# Patient Record
Sex: Female | Born: 1955 | Race: Black or African American | Hispanic: No | Marital: Single | Smoking: Current every day smoker
Health system: Southern US, Community
[De-identification: ages and names within clinical notes are randomized; demographics above are authoritative.]

## PROBLEM LIST (undated history)

## (undated) DIAGNOSIS — Z720 Tobacco use: Secondary | ICD-10-CM

## (undated) DIAGNOSIS — I517 Cardiomegaly: Secondary | ICD-10-CM

## (undated) DIAGNOSIS — I071 Rheumatic tricuspid insufficiency: Secondary | ICD-10-CM

## (undated) DIAGNOSIS — J449 Chronic obstructive pulmonary disease, unspecified: Secondary | ICD-10-CM

## (undated) DIAGNOSIS — I340 Nonrheumatic mitral (valve) insufficiency: Secondary | ICD-10-CM

## (undated) DIAGNOSIS — I272 Pulmonary hypertension, unspecified: Secondary | ICD-10-CM

## (undated) HISTORY — DX: Rheumatic tricuspid insufficiency: I07.1

## (undated) HISTORY — DX: Pulmonary hypertension, unspecified: I27.20

## (undated) HISTORY — DX: Cardiomegaly: I51.7

## (undated) HISTORY — DX: Nonrheumatic mitral (valve) insufficiency: I34.0

## (undated) HISTORY — DX: Tobacco use: Z72.0

## (undated) HISTORY — DX: Chronic obstructive pulmonary disease, unspecified: J44.9

---

## 2004-10-20 ENCOUNTER — Ambulatory Visit: Payer: Self-pay

## 2005-11-23 ENCOUNTER — Ambulatory Visit: Payer: Self-pay

## 2013-05-06 ENCOUNTER — Emergency Department: Payer: Self-pay | Admitting: Emergency Medicine

## 2013-05-06 LAB — URINALYSIS, COMPLETE
Blood: NEGATIVE
Nitrite: NEGATIVE
Protein: NEGATIVE
RBC,UR: 1 /HPF (ref 0–5)
Specific Gravity: 1.02 (ref 1.003–1.030)
Squamous Epithelial: <1
WBC UR: 1 /HPF (ref 0–5)

## 2013-05-06 LAB — COMPREHENSIVE METABOLIC PANEL
Albumin: 4.1 g/dL (ref 3.4–5.0)
Alkaline Phosphatase: 125 U/L (ref 50–136)
Anion Gap: 7 (ref 7–16)
BUN: 13 mg/dL (ref 7–18)
Bilirubin,Total: 0.3 mg/dL (ref 0.2–1.0)
Chloride: 107 mmol/L (ref 98–107)
Co2: 25 mmol/L (ref 21–32)
Creatinine: 0.88 mg/dL (ref 0.60–1.30)
Glucose: 87 mg/dL (ref 65–99)
Osmolality: 277 (ref 275–301)
Potassium: 3.7 mmol/L (ref 3.5–5.1)
SGOT(AST): 20 U/L (ref 15–37)
Sodium: 139 mmol/L (ref 136–145)
Total Protein: 8.1 g/dL (ref 6.4–8.2)

## 2013-05-06 LAB — CBC
HCT: 39.9 % (ref 35.0–47.0)
HGB: 13.5 g/dL (ref 12.0–16.0)
MCH: 29.8 pg (ref 26.0–34.0)
MCHC: 33.8 g/dL (ref 32.0–36.0)
MCV: 88 fL (ref 80–100)
Platelet: 200 10*3/uL (ref 150–440)
RBC: 4.52 10*6/uL (ref 3.80–5.20)

## 2013-05-06 LAB — TROPONIN I: Troponin-I: <0.02 ng/mL

## 2013-05-06 LAB — LIPASE, BLOOD: Lipase: 168 U/L (ref 73–393)

## 2014-06-19 IMAGING — CT CT ABD-PELV W/ CM
1 of 3 series · 13 of 32 positions shown, 18 images · non-contrast
Comparison: none

REASON FOR EXAM: (1) RLQ tenderness; (2) RLQ temderness
COMMENTS:

[Series 2: 3mm soft tissue · axial · 0.68mm/px · z∈[-454,-67]mm · 13 of 145 slices shown, 18 images]
[im 8/145  soft-tissue]
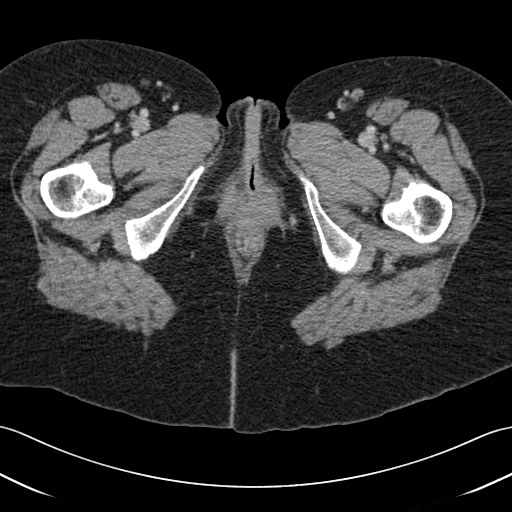
[im 8/145  bone]
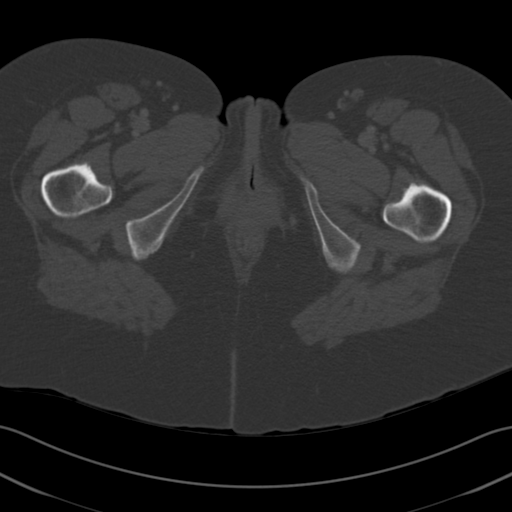
[im 23/145  soft-tissue]
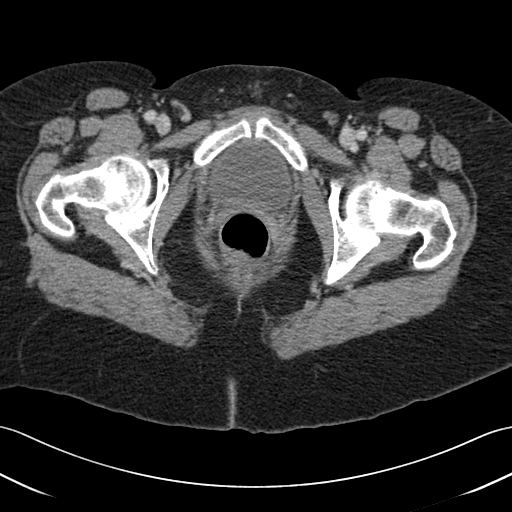
[im 31/145  soft-tissue]
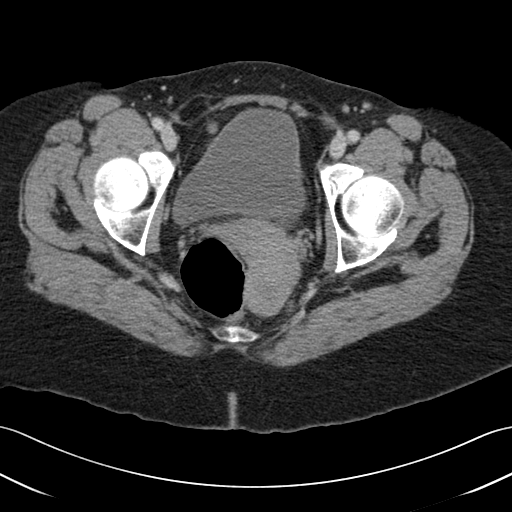
[im 46/145  soft-tissue]
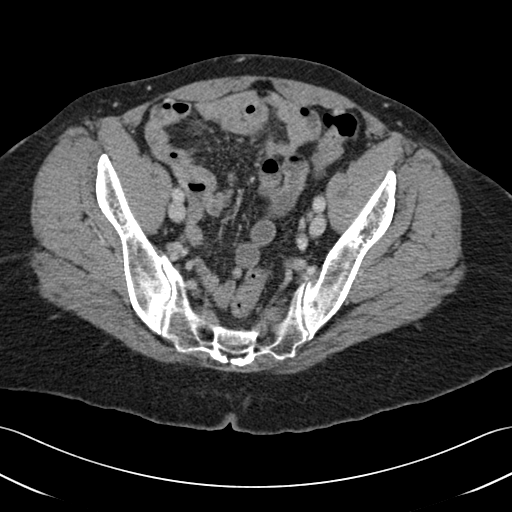
[im 54/145  soft-tissue]
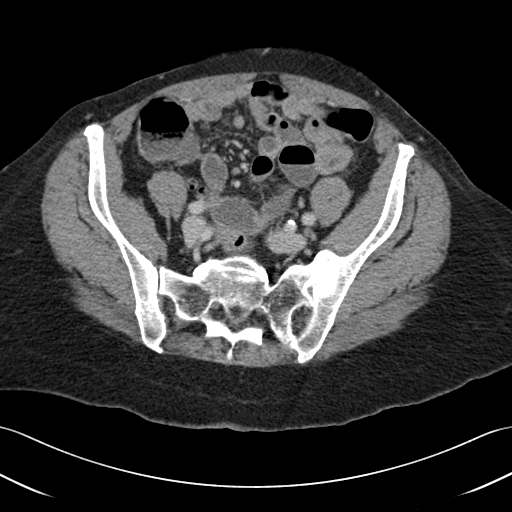
[im 69/145  soft-tissue]
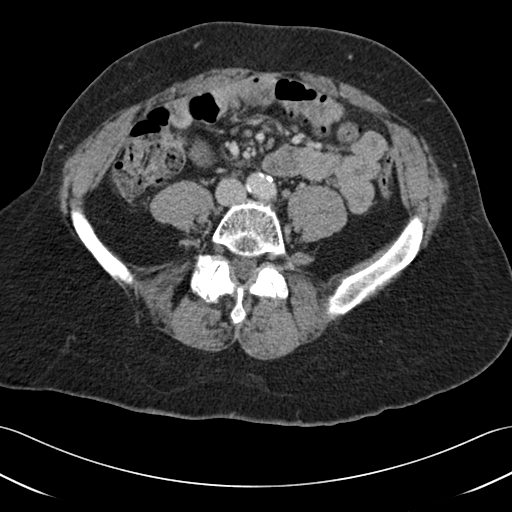
[im 76/145  soft-tissue]
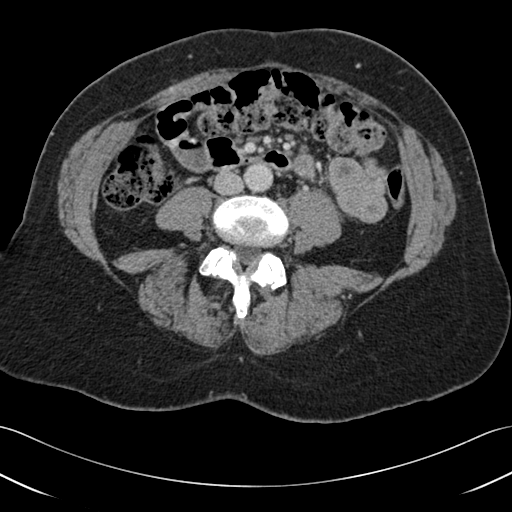
[im 91/145  soft-tissue]
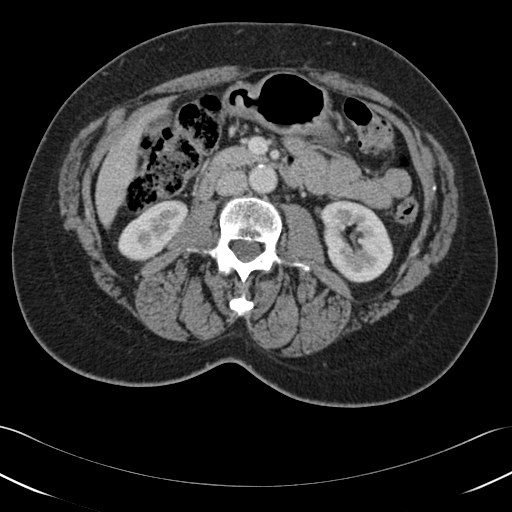
[im 99/145  soft-tissue]
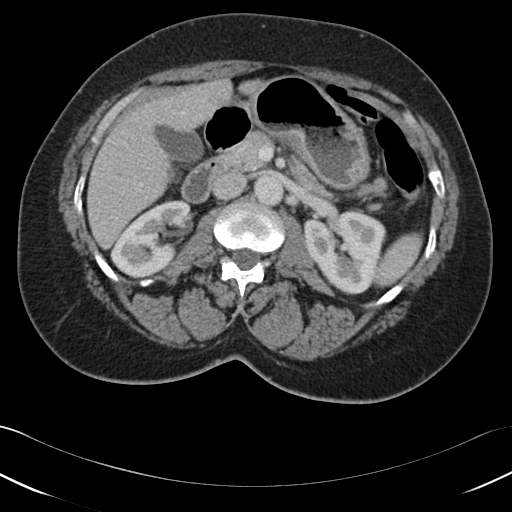
[im 99/145  bone]
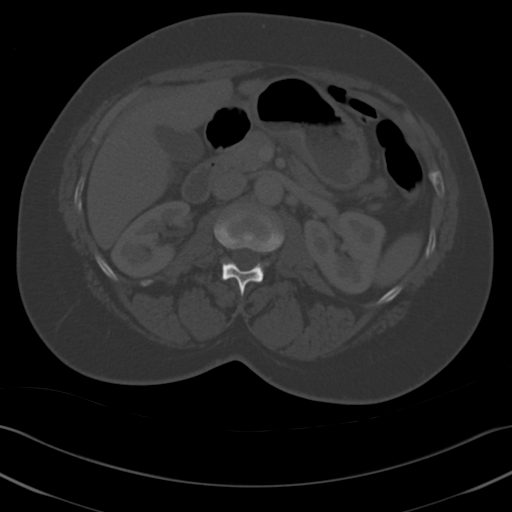
[im 114/145  soft-tissue]
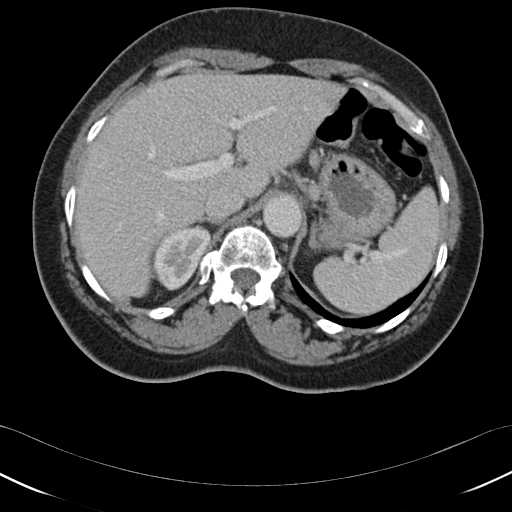
[im 114/145  lung]
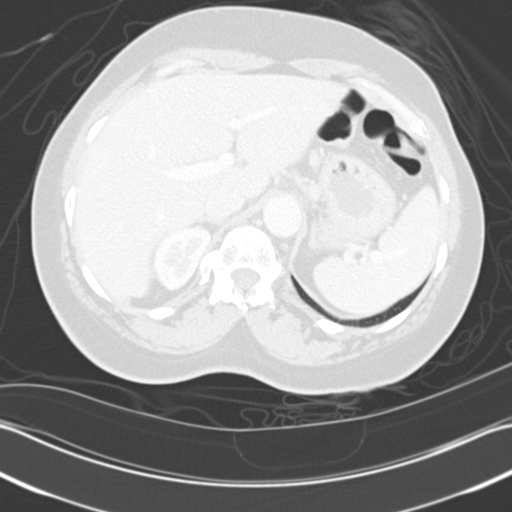
[im 122/145  soft-tissue]
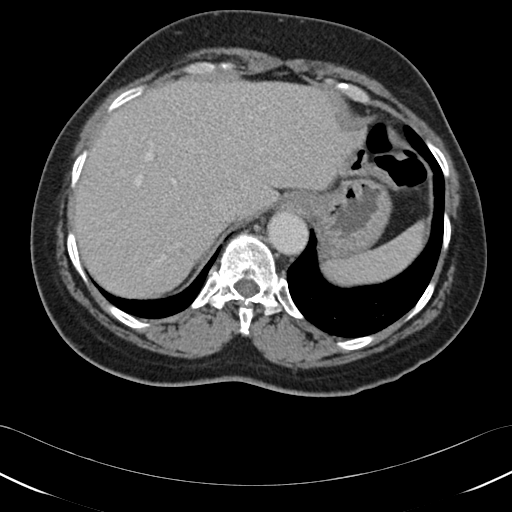
[im 122/145  lung]
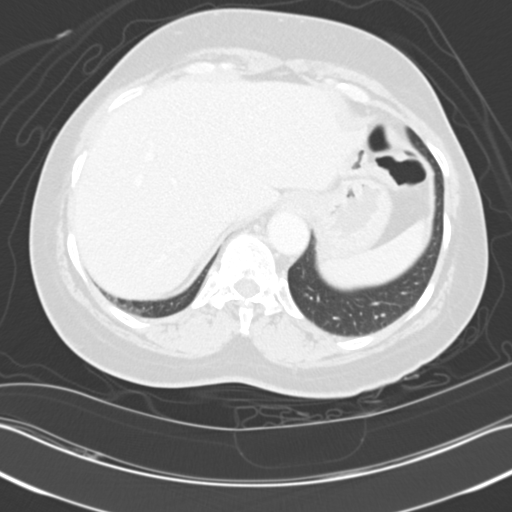
[im 129/145  lung]
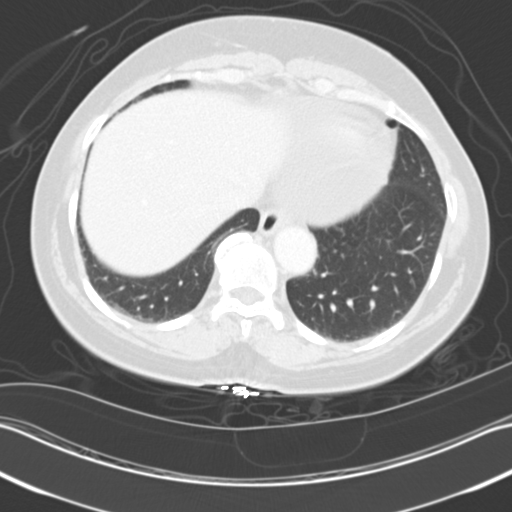
[im 137/145  soft-tissue]
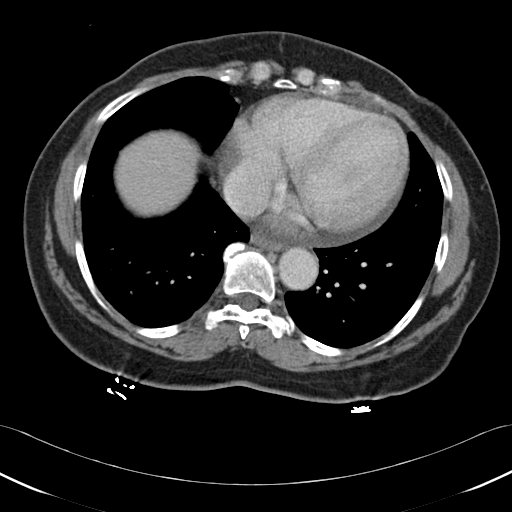
[im 137/145  lung]
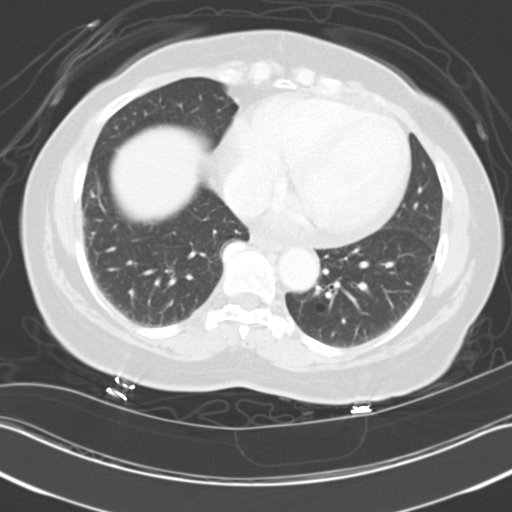

[13 of 32 positions shown; findings below may reference images not displayed]

PROCEDURE:     CT  - CT ABDOMEN / PELVIS  W  - May 07, 2013  [DATE]

RESULT:     Axial CT scanning was performed through the abdomen and pelvis
with reconstructions at 3 mm intervals and slice thicknesses following
intravenous administration of 100 cc of Csovue-D4O. The patient did not
receive oral contrast.

Within the lower thorax the cardiac chambers are top normal in size. There
is a small to moderate sized pericardial effusion. The lung bases are clear.

The liver exhibits no focal mass or ductal dilation. The gallbladder is
adequately distended with no evidence of stones, wall thickening, or
pericholecystic fluid. The pancreas, nondistended stomach, spleen, adrenal
glands, and kidneys are normal in appearance. There is a small hiatal
hernia. The caliber of the abdominal aorta is normal. There is no periaortic
or pericaval lymphadenopathy.

The unopacified loops of small and large bowel exhibit no evidence of ileus
nor of obstruction. There is no evidence of enteritis or colitis the
diverticulitis. There is a normal calibered uninflamed gas-filled appendix
demonstrated on images 85 through
99.

Within the pelvis the uterus is situated to the left of midline is somewhat
retroverted. There is likely a fibroid measuring 3 mm in diameter in the
uterine fundus. There are no adnexal masses. There is no free pelvic fluid.
The partially distended urinary bladder is normal in appearance. There is no
inguinal nor significant umbilical hernia.

On delayed images contrast within the renal collecting systems is normal.
The lumbar vertebral bodies are preserved in height.
IMPRESSION: 1. There is a small to moderate sized pericardial effusion.
2. No acute abnormality of the hepatobiliary tree is demonstrated. There is
a punctate splenic calcification.
3. No acute urinary tract abnormality is demonstrated.
4. No acute abnormality of the bowel is demonstrated.
5. There is likely a fibroid in the retroverted uterus. No adnexal masses
are demonstrated.

A preliminary report was sent to the [HOSPITAL] the conclusion
of the study.

[REDACTED]

## 2016-04-14 ENCOUNTER — Inpatient Hospital Stay
Admission: EM | Admit: 2016-04-14 | Discharge: 2016-04-16 | DRG: 871 | Disposition: A | Discharge status: Home / Self Care | Payer: Managed Care, Other (non HMO) | Attending: Internal Medicine | Admitting: Internal Medicine

## 2016-04-14 ENCOUNTER — Encounter: Payer: Self-pay | Admitting: Emergency Medicine

## 2016-04-14 ENCOUNTER — Emergency Department: Payer: Managed Care, Other (non HMO)

## 2016-04-14 DIAGNOSIS — Z6826 Body mass index (BMI) 26.0-26.9, adult: Secondary | ICD-10-CM

## 2016-04-14 DIAGNOSIS — F17210 Nicotine dependence, cigarettes, uncomplicated: Secondary | ICD-10-CM | POA: Diagnosis present

## 2016-04-14 DIAGNOSIS — D649 Anemia, unspecified: Secondary | ICD-10-CM

## 2016-04-14 DIAGNOSIS — A419 Sepsis, unspecified organism: Secondary | ICD-10-CM | POA: Diagnosis present

## 2016-04-14 DIAGNOSIS — J189 Pneumonia, unspecified organism: Secondary | ICD-10-CM | POA: Diagnosis not present

## 2016-04-14 DIAGNOSIS — D72829 Elevated white blood cell count, unspecified: Secondary | ICD-10-CM

## 2016-04-14 DIAGNOSIS — E876 Hypokalemia: Secondary | ICD-10-CM

## 2016-04-14 DIAGNOSIS — R0781 Pleurodynia: Secondary | ICD-10-CM

## 2016-04-14 LAB — LIPASE, BLOOD: Lipase: 16 U/L (ref 11–51)

## 2016-04-14 LAB — COMPREHENSIVE METABOLIC PANEL
ALBUMIN: 3.7 g/dL (ref 3.5–5.0)
ALK PHOS: 71 U/L (ref 38–126)
ALT: 19 U/L (ref 14–54)
AST: 23 U/L (ref 15–41)
Anion gap: 11 (ref 5–15)
BILIRUBIN TOTAL: 1 mg/dL (ref 0.3–1.2)
BUN: 15 mg/dL (ref 6–20)
CALCIUM: 9.5 mg/dL (ref 8.9–10.3)
CHLORIDE: 105 mmol/L (ref 101–111)
CO2: 24 mmol/L (ref 22–32)
CREATININE: 0.95 mg/dL (ref 0.44–1.00)
Glucose, Bld: 124 mg/dL — ABNORMAL HIGH (ref 65–99)
Potassium: 3.3 mmol/L — ABNORMAL LOW (ref 3.5–5.1)
Sodium: 140 mmol/L (ref 135–145)
Total Protein: 7.6 g/dL (ref 6.5–8.1)

## 2016-04-14 LAB — CBC WITH DIFFERENTIAL/PLATELET
Basophils Absolute: 0 10*3/uL (ref 0–0.1)
Basophils Relative: 0 %
EOS ABS: 0 10*3/uL (ref 0–0.7)
EOS PCT: 0 %
HCT: 42.4 % (ref 35.0–47.0)
Hemoglobin: 14.3 g/dL (ref 12.0–16.0)
LYMPHS ABS: 0.7 10*3/uL — AB (ref 1.0–3.6)
LYMPHS PCT: 5 %
MCH: 30.3 pg (ref 26.0–34.0)
MCHC: 33.6 g/dL (ref 32.0–36.0)
MCV: 89.9 fL (ref 80.0–100.0)
MONO ABS: 0.7 10*3/uL (ref 0.2–0.9)
Monocytes Relative: 5 %
Neutro Abs: 12.5 10*3/uL — ABNORMAL HIGH (ref 1.4–6.5)
Neutrophils Relative %: 90 %
PLATELETS: 206 10*3/uL (ref 150–440)
RBC: 4.72 MIL/uL (ref 3.80–5.20)
RDW: 13.2 % (ref 11.5–14.5)
WBC: 14 10*3/uL — AB (ref 3.6–11.0)

## 2016-04-14 MED ORDER — NICOTINE 21 MG/24HR TD PT24
21.0000 mg | MEDICATED_PATCH | Freq: Every day | TRANSDERMAL | Status: DC
  Administered 2016-04-14: 16:00:00 21 mg via TRANSDERMAL
  Filled 2016-04-14: qty 1

## 2016-04-14 MED ORDER — ONDANSETRON HCL 4 MG/2ML IJ SOLN: 4.0000 mg | Freq: Four times a day (QID) | INTRAMUSCULAR | Status: DC | PRN

## 2016-04-14 MED ORDER — IPRATROPIUM-ALBUTEROL 0.5-2.5 (3) MG/3ML IN SOLN
3.0000 mL | Freq: Once | RESPIRATORY_TRACT | Status: AC
  Administered 2016-04-14: 3 mL via RESPIRATORY_TRACT
  Filled 2016-04-14: qty 3

## 2016-04-14 MED ORDER — ONDANSETRON HCL 4 MG PO TABS: 4.0000 mg | ORAL_TABLET | Freq: Four times a day (QID) | ORAL | Status: DC | PRN

## 2016-04-14 MED ORDER — MORPHINE SULFATE (PF) 4 MG/ML IV SOLN
4.0000 mg | Freq: Once | INTRAVENOUS | Status: AC
  Administered 2016-04-14: 4 mg via INTRAVENOUS
  Filled 2016-04-14: qty 1

## 2016-04-14 MED ORDER — SODIUM CHLORIDE 0.9 % IV BOLUS (SEPSIS)
1000.0000 mL | Freq: Once | INTRAVENOUS | Status: AC
Start: 2016-04-14 — End: 2016-04-14
  Administered 2016-04-14: 1000 mL via INTRAVENOUS

## 2016-04-14 MED ORDER — ALBUTEROL SULFATE (2.5 MG/3ML) 0.083% IN NEBU: 5.0000 mg | INHALATION_SOLUTION | Freq: Once | RESPIRATORY_TRACT | Status: DC

## 2016-04-14 MED ORDER — IPRATROPIUM-ALBUTEROL 0.5-2.5 (3) MG/3ML IN SOLN
3.0000 mL | RESPIRATORY_TRACT | Status: DC
Start: 2016-04-14 — End: 2016-04-15
  Administered 2016-04-14 – 2016-04-15 (×6): 3 mL via RESPIRATORY_TRACT
  Filled 2016-04-14 (×6): qty 3

## 2016-04-14 MED ORDER — ONDANSETRON HCL 4 MG/2ML IJ SOLN
4.0000 mg | Freq: Once | INTRAMUSCULAR | Status: AC
  Administered 2016-04-14: 4 mg via INTRAVENOUS
  Filled 2016-04-14: qty 2

## 2016-04-14 MED ORDER — POTASSIUM CHLORIDE CRYS ER 20 MEQ PO TBCR
40.0000 meq | EXTENDED_RELEASE_TABLET | Freq: Once | ORAL | Status: AC
Start: 2016-04-14 — End: 2016-04-14
  Administered 2016-04-14: 40 meq via ORAL
  Filled 2016-04-14: qty 2

## 2016-04-14 MED ORDER — ENOXAPARIN SODIUM 40 MG/0.4ML ~~LOC~~ SOLN
40.0000 mg | SUBCUTANEOUS | Status: DC
  Administered 2016-04-14: 16:00:00 40 mg via SUBCUTANEOUS
  Filled 2016-04-14 (×2): qty 0.4

## 2016-04-14 MED ORDER — GUAIFENESIN-CODEINE 100-10 MG/5ML PO SOLN
10.0000 mL | ORAL | Status: DC | PRN
  Administered 2016-04-14 – 2016-04-15 (×3): 10 mL via ORAL
  Filled 2016-04-14 (×3): qty 10

## 2016-04-14 MED ORDER — MORPHINE SULFATE (PF) 2 MG/ML IV SOLN
2.0000 mg | INTRAVENOUS | Status: DC | PRN
  Administered 2016-04-15 – 2016-04-16 (×2): 2 mg via INTRAVENOUS
  Filled 2016-04-14 (×2): qty 1

## 2016-04-14 MED ORDER — OXYCODONE HCL 5 MG PO TABS
5.0000 mg | ORAL_TABLET | ORAL | Status: DC | PRN
  Administered 2016-04-14: 5 mg via ORAL
  Filled 2016-04-14: qty 1

## 2016-04-14 MED ORDER — SODIUM CHLORIDE 0.9 % IV SOLN
INTRAVENOUS | Status: DC
  Administered 2016-04-14 – 2016-04-16 (×4): via INTRAVENOUS

## 2016-04-14 MED ORDER — LEVOFLOXACIN IN D5W 750 MG/150ML IV SOLN
750.0000 mg | INTRAVENOUS | Status: DC
  Administered 2016-04-14 – 2016-04-15 (×2): 750 mg via INTRAVENOUS
  Filled 2016-04-14 (×3): qty 150

## 2016-04-14 MED ORDER — ACETAMINOPHEN 325 MG PO TABS: 650.0000 mg | ORAL_TABLET | Freq: Four times a day (QID) | ORAL | Status: DC | PRN

## 2016-04-14 MED ORDER — LEVOFLOXACIN IN D5W 750 MG/150ML IV SOLN
750.0000 mg | Freq: Once | INTRAVENOUS | Status: AC
  Administered 2016-04-14: 750 mg via INTRAVENOUS
  Filled 2016-04-14: qty 150

## 2016-04-14 MED ORDER — ACETAMINOPHEN 650 MG RE SUPP: 650.0000 mg | Freq: Four times a day (QID) | RECTAL | Status: DC | PRN

## 2016-04-14 NOTE — ED Provider Notes (Signed)
[facility] Emergency Department Provider Note  ____________________________________________  Time seen: 10:20 AM  I have reviewed the triage vital signs and the nursing notes.   HISTORY  Chief Complaint Shortness of Breath    HPI Brianna Gibson is a 60 y.o. female who complains of gradually worsening shortness of breath with productive cough for the past 2-3 days. She reports subjective fevers and chills and night sweats. She has been drinking a concoction of lemon juice and garlic and vapor rub. Denies abdominal pain nausea or vomiting but has had some diarrhea over the past day or 2 as well. Does not use oxygen at home. Denies medical history.     History reviewed. No pertinent past medical history. None none  There are no active problems to display for this patient.    History reviewed. No pertinent past surgical history.   No current outpatient prescriptions on file. None  Allergies Review of patient's allergies indicates no known allergies.   History reviewed. No pertinent family history.  Social History Social History  Substance Use Topics  . Smoking status: Current Every Day Smoker  . Smokeless tobacco: None  . Alcohol Use: None    Review of Systems  Constitutional:   Subjective fevers and chills.  Eyes:   No vision changes.  ENT:   No sore throat. No rhinorrhea. Cardiovascular:   No chest pain. Respiratory:   Positive shortness of breath and productive cough. Gastrointestinal:   Negative for abdominal pain, vomiting positive diarrhea.  Genitourinary:   Negative for dysuria or difficulty urinating. Musculoskeletal:   Negative for focal pain or swelling Neurological:   Negative for headaches 10-point ROS otherwise negative.  ____________________________________________   PHYSICAL EXAM:  VITAL SIGNS: ED Triage Vitals  Enc Vitals Group     BP 04/14/16 1003 135/82 mmHg     Pulse Rate 04/14/16 1003 86     Resp 04/14/16  1015 31     Temp 04/14/16 1003 97.6 F (36.4 C)     Temp Source 04/14/16 1003 Oral     SpO2 04/14/16 1003 90 %     Weight 04/14/16 1003 140 lb (63.504 kg)     Height 04/14/16 1003 5\' 7"  (1.702 m)     Head Cir --      Peak Flow --      Pain Score 04/14/16 1004 5     Pain Loc --      Pain Edu? --      Excl. in GC? --     Vital signs reviewed, nursing assessments reviewed.   Constitutional:   Alert and oriented. Ill-appearing. Eyes:   No scleral icterus. No conjunctival pallor. PERRL. EOMI.  No nystagmus. ENT   Head:   Normocephalic and atraumatic.   Nose:   No congestion/rhinnorhea. No septal hematoma   Mouth/Throat:   MMM, no pharyngeal erythema. No peritonsillar mass.    Neck:   No stridor. No SubQ emphysema. No meningismus. Hematological/Lymphatic/Immunilogical:   No cervical lymphadenopathy. Cardiovascular:   RRR. Symmetric bilateral radial and DP pulses.  No murmurs.  Respiratory:   Tachypnea, good air entry in all lung fields, rhonchi in the right base. Expiratory wheezing with coughing.. Gastrointestinal:   Soft with epigastric and left upper quadrant tenderness. Non distended. There is no CVA tenderness.  No rebound, rigidity, or guarding. Genitourinary:   deferred Musculoskeletal:   Nontender with normal range of motion in all extremities. No joint effusions.  No lower extremity tenderness.  No edema. Neurologic:  Normal speech and language.  CN 2-10 normal. Motor grossly intact. No gross focal neurologic deficits are appreciated.  Skin:    Skin is warm, moist and intact. No rash noted.  No petechiae, purpura, or bullae.  ____________________________________________    LABS (pertinent positives/negatives) (all labs ordered are listed, but only abnormal results are displayed) Labs Reviewed  COMPREHENSIVE METABOLIC PANEL - Abnormal; Notable for the following:    Potassium 3.3 (*)    Glucose, Bld 124 (*)    All other components within normal limits   CBC WITH DIFFERENTIAL/PLATELET - Abnormal; Notable for the following:    WBC 14.0 (*)    Neutro Abs 12.5 (*)    Lymphs Abs 0.7 (*)    All other components within normal limits  LIPASE, BLOOD   ____________________________________________   EKG  Interpreted by me Normal sinus rhythm rate of 87, normal axis and intervals. Poor R-wave progression in anterior precordial leads. Normal ST segments and T waves  ____________________________________________    RADIOLOGY  Chest x-ray consistent with right upper lobe and right lower lobe pneumonia  ____________________________________________   PROCEDURES   ____________________________________________   INITIAL IMPRESSION / ASSESSMENT AND PLAN / ED COURSE  Pertinent labs & imaging results that were available during my care of the patient were reviewed by me and considered in my medical decision making (see chart for details).  Patient presents with symptoms and exam concerning for community acquired pneumonia. She also has room air hypoxia with a room air saturation 87% on my exam, she is placed on 3 L nasal cannula to maintain her sat in the 90s. She is breathing more comfortably with this but complains of significant pain. I initially gave her IV Toradol for pain control which was ineffective so I then gave her IV morphine. She does have a leukocytosis as well. She was given IV fluids and IV Levaquin. Case was discussed with the hospitalist about 1:15 PM for admission to the hospital.     ____________________________________________   FINAL CLINICAL IMPRESSION(S) / ED DIAGNOSES  Final diagnoses:  Community acquired pneumonia       Portions of this note were generated with dragon dictation software. Dictation errors may occur despite best attempts at proofreading.   Sharman Cheek, MD 04/14/16 1315

## 2016-04-14 NOTE — ED Notes (Signed)
Per Dr. Scotty CourtStafford no blood cultures needed before abx

## 2016-04-14 NOTE — H&P (Signed)
[facility] [place] [facility]   PATIENT NAME: Brianna Gibson    MR#:  [id number]  DATE OF BIRTH:  25-Aug-1956   DATE OF ADMISSION:  04/14/2016  PRIMARY CARE PHYSICIAN: No primary care provider on file.   REQUESTING/REFERRING PHYSICIAN: Scotty CourtStafford  CHIEF COMPLAINT:   Chief Complaint  Patient presents with  . Shortness of Breath    HISTORY OF PRESENT ILLNESS:  Brianna Gibson  is a 60 y.o. female without significant medical history who is present presenting with cough shortness of breath and fatigue. She states that she has had a productive cough for approximately 2 week total duration which has been progressively worsening, productive of yellowish sputum positive fatigue and shortness of breath particularly dyspnea on exertion even minimal. She does describe right-sided chest pain particularly of the lower mid axillary line worsened with cough as well as inspiration described only as "pain" intensity 6/10 nonradiating. Found to have pneumonia on chest x-ray on initial evaluation, desaturation 90% room air at rest  PAST MEDICAL HISTORY:  History reviewed. No pertinent past medical history.  PAST SURGICAL HISTORY:  History reviewed. No pertinent past surgical history.  SOCIAL HISTORY:   Social History  Substance Use Topics  . Smoking status: Current Every Day Smoker  . Smokeless tobacco: Not on file  . Alcohol Use: Not on file    FAMILY HISTORY:   Family History  Problem Relation Age of Onset  . Hypertension Mother     DRUG ALLERGIES:  No Known Allergies  REVIEW OF SYSTEMS:  REVIEW OF SYSTEMS:  CONSTITUTIONAL: Denies fevers, chills,Positive fatigue, weakness.  EYES: Denies blurred vision, double vision, or eye pain.  EARS, NOSE, THROAT: Denies tinnitus, ear pain, hearing loss.  RESPIRATORY: Positive cough, shortness of breath, denies wheezing  CARDIOVASCULAR: Denies chest pain, palpitations, edema.  GASTROINTESTINAL: Denies nausea, vomiting,  diarrhea, abdominal pain.  GENITOURINARY: Denies dysuria, hematuria.  ENDOCRINE: Denies nocturia or thyroid problems. HEMATOLOGIC AND LYMPHATIC: Denies easy bruising or bleeding.  SKIN: Denies rash or lesions.  MUSCULOSKELETAL: Denies pain in neck, back, shoulder, knees, hips, or further arthritic symptoms.  NEUROLOGIC: Denies paralysis, paresthesias.  PSYCHIATRIC: Denies anxiety or depressive symptoms. Otherwise full review of systems performed by me is negative.   MEDICATIONS AT HOME:   Prior to Admission medications   Medication Sig Start Date End Date Taking? Authorizing Provider  ibuprofen (ADVIL,MOTRIN) 200 MG tablet Take 400 mg by mouth every 6 (six) hours as needed.   Yes Historical Provider, MD      VITAL SIGNS:  Blood pressure 118/82, pulse 65, temperature 97.6 F (36.4 C), temperature source Oral, resp. rate 17, height 5\' 7"  (1.702 m), weight 63.504 kg (140 lb), SpO2 100 %.  PHYSICAL EXAMINATION:  VITAL SIGNS: Filed Vitals:   04/14/16 1200 04/14/16 1300  BP: 124/79 118/82  Pulse: 69 65  Temp:    Resp: 29 17   GENERAL:59 y.o.female currently Ill appearing  HEAD: Normocephalic, atraumatic.  EYES: Pupils equal, round, reactive to light. Extraocular muscles intact. No scleral icterus.  MOUTH: Moist mucosal membrane. Dentition intact. No abscess noted.  EAR, NOSE, THROAT: Clear without exudates. No external lesions.  NECK: Supple. No thyromegaly. No nodules. No JVD.  PULMONARY: Conversational dyspnea, decreased breath sounds with scattered rhonchi right base without wheeze No use of accessory muscles, Good respiratory effort. good air entry bilaterally CHEST: Nontender to palpation.  CARDIOVASCULAR: S1 and S2. Regular rate and rhythm. No murmurs, rubs, or gallops. No edema. Pedal pulses 2+ bilaterally.  GASTROINTESTINAL: Soft, nontender, nondistended. No masses. Positive bowel sounds. No hepatosplenomegaly.  MUSCULOSKELETAL: No swelling, clubbing, or edema. Range of  motion full in all extremities.  NEUROLOGIC: Cranial nerves II through XII are intact. No gross focal neurological deficits. Sensation intact. Reflexes intact.  SKIN: No ulceration, lesions, rashes, or cyanosis. Skin warm and dry. Turgor intact.  PSYCHIATRIC: Mood, affect within normal limits. The patient is awake, alert and oriented x 3. Insight, judgment intact.    LABORATORY PANEL:   CBC  Recent Labs Lab 04/14/16 1018  WBC 14.0*  HGB 14.3  HCT 42.4  PLT 206   ------------------------------------------------------------------------------------------------------------------  Chemistries   Recent Labs Lab 04/14/16 1018  NA 140  K 3.3*  CL 105  CO2 24  GLUCOSE 124*  BUN 15  CREATININE 0.95  CALCIUM 9.5  AST 23  ALT 19  ALKPHOS 71  BILITOT 1.0   ------------------------------------------------------------------------------------------------------------------  Cardiac Enzymes No results for input(s): TROPONINI in the last 168 hours. ------------------------------------------------------------------------------------------------------------------  RADIOLOGY:  Dg Chest 2 View  04/14/2016  CLINICAL DATA:  Cough with with Short of breath. Right chest pain and wheezing EXAM: CHEST  2 VIEW COMPARISON:  None. FINDINGS: Right upper lobe infiltrate consistent with pneumonia. There is a patchy area of right lower lobe infiltrate all also consistent with pneumonia. No significant pleural effusion Left lung is clear. Mild cardiac enlargement without heart failure. IMPRESSION: Right upper lobe and right lower lobe pneumonia. No pleural effusion. Electronically Signed   By: Marlan Palau M.D.   On: 04/14/2016 11:40    EKG:   Orders placed or performed during the hospital encounter of 04/14/16  . EKG 12-Lead  . EKG 12-Lead    IMPRESSION AND PLAN:   60 year old African-American female without significant medical history presenting with shortness of breath  1.Sepsis, meeting  septic criteria by leukocytosis tachycardia tachypnea present on arrival. Source community-acquired pneumonia Panculture. Broad-spectrum antibiotics including Levaquin and taper antibiotics when culture data returns.. Continue IV fluid hydration to keep mean arterial pressure greater than 65. , Oxygen as required, breathing treatments  2. Hypokalemia replace goal 4-5 3. Tobacco abuse: Currently 1 pack a day smoker for numerous years, patient has desire to stop smoking last cigarette approximately 5 days ago patient requests nicotine patch while in the hospital discussed multiple options for smoking cessation on discharge including nicotine replacement, Chantix, Wellbutrin, electronic cigarette-she does not wish to start one of these medications at this time total time spent    All the records are reviewed and case discussed with ED provider. Management plans discussed with the patient, family and they are in agreement.  CODE STATUS: Full  TOTAL TIME TAKING CARE OF THIS PATIENT: 44 minutes.    Hower,  Mardi Mainland.D on 04/14/2016 at 1:48 PM  Between 7am to 6pm - Pager - [phone]  After 6pm: House Pager: - [phone]  [facility]  Office  [phone]  CC: Primary care physician; No primary care provider on file.

## 2016-04-14 NOTE — ED Notes (Signed)
Pt with productive cough with yellow sputum since Tuesday. Sent here from PCP for low sats. 90% RA. 93% 2LNC.

## 2016-04-15 LAB — GLUCOSE, CAPILLARY: Glucose-Capillary: 101 mg/dL — ABNORMAL HIGH (ref 65–99)

## 2016-04-15 MED ORDER — ENSURE ENLIVE PO LIQD
237.0000 mL | Freq: Two times a day (BID) | ORAL | Status: DC
  Administered 2016-04-15: 237 mL via ORAL

## 2016-04-15 MED ORDER — MECLIZINE HCL 25 MG PO TABS
25.0000 mg | ORAL_TABLET | Freq: Three times a day (TID) | ORAL | Status: DC | PRN
  Filled 2016-04-15: qty 1

## 2016-04-15 MED ORDER — IPRATROPIUM-ALBUTEROL 0.5-2.5 (3) MG/3ML IN SOLN
3.0000 mL | Freq: Three times a day (TID) | RESPIRATORY_TRACT | Status: DC
Start: 2016-04-15 — End: 2016-04-16
  Administered 2016-04-15 – 2016-04-16 (×3): 3 mL via RESPIRATORY_TRACT
  Filled 2016-04-15 (×2): qty 3

## 2016-04-15 NOTE — Progress Notes (Signed)
Initial Nutrition Assessment  DOCUMENTATION CODES:   Not applicable  INTERVENTION:   -Cater to pt preferences -Recommend Ensure Enlive po BID, each supplement provides 350 kcal and 20 grams of protein   NUTRITION DIAGNOSIS:   Inadequate oral intake related to poor appetite as evidenced by per patient/family report, meal completion < 25%.  GOAL:   Patient will meet greater than or equal to 90% of their needs  MONITOR:   PO intake, Supplement acceptance, Labs, Weight trends, I & O's  REASON FOR ASSESSMENT:   Malnutrition Screening Tool    ASSESSMENT:   Pt admitted with cough for 2 weeks PTA secondary to community acquired pna and hypokalemia.  History reviewed. No pertinent past medical history.   Diet Order:  Diet Heart Room service appropriate?: Yes; Fluid consistency:: Thin    Pt reports not eating anything since Tuesday, where she ate liver and potatoes and even that she was not able to finish. Pt reports prior to that pt eating soups and salty foods mostly.  Pt c/o taste aversions. Pt reports 'nothing tastes right' which is why she has not wanted to eat at all since Tuesday. Pt does report tolerating cold foods well, such as ice cream or sherbet.   Pt reports eating a sherbet last night with bites of her chicken. This morning on visit pt ate two bites of peaches and had been sipping on cranberry juice to which pt reports 'doesn't taste right either.'   Medications: NS at 11700mL/hr  Labs: K 3.3 (supplemented)   Gastrointestinal Profile: Last BM:  04/12/2016    Nutrition-Focused Physical Exam Findings: Nutrition-Focused physical exam completed. Findings are WDL for fat depletion, muscle depletion, and edema.     Weight Change: Pt reports UBW of 199-200lbs the first of the year and wearing a size 18. Pt reports she has been losing weight but does not know what she is now. Pt reports wearing a uniform and that now she has to wear a belt to keep her pants up. (15%  weight loss in 4 months)   Skin:  Reviewed, no issues   Height:   Ht Readings from Last 1 Encounters:  04/14/16 5\' 7"  (1.702 m)    Weight:   Wt Readings from Last 1 Encounters:  04/14/16 169 lb (76.658 kg)    BMI:  Body mass index is 26.46 kg/(m^2).  Estimated Nutritional Needs:   Kcal:  1818-2150kcals  Protein:  66-77g protein  Fluid:  >2L fluid  EDUCATION NEEDS:   No education needs identified at this time   Brianna Gibson, RD, LDN Pager [phone] Weekend/On-Call Pager [phone]

## 2016-04-15 NOTE — Progress Notes (Signed)
[facility] [place] [facility]   PATIENT NAME: Brianna Gibson    MR#:  [id number]  DATE OF BIRTH:  1956/04/02  SUBJECTIVE:  CHIEF COMPLAINT:   Chief Complaint  Patient presents with  . Shortness of Breath   Cough, sputum and shortness of breath. Right-sided chest pain while deep breathing. REVIEW OF SYSTEMS:  CONSTITUTIONAL: No fever, Has poor appetite and generalized weakness.  EYES: No blurred or double vision.  EARS, NOSE, AND THROAT: No tinnitus or ear pain.  RESPIRATORY: has cough, shortness of breath, wheezing but no hemoptysis.  CARDIOVASCULAR: No chest pain, orthopnea, edema.  GASTROINTESTINAL: No nausea, vomiting, diarrhea or abdominal pain.  GENITOURINARY: No dysuria, hematuria.  ENDOCRINE: No polyuria, nocturia,  HEMATOLOGY: No anemia, easy bruising or bleeding SKIN: No rash or lesion. MUSCULOSKELETAL: No joint pain or arthritis.   NEUROLOGIC: No tingling, numbness, weakness.  PSYCHIATRY: No anxiety or depression.   DRUG ALLERGIES:  No Known Allergies  VITALS:  Blood pressure 112/69, pulse 79, temperature 98.8 F (37.1 C), temperature source Oral, resp. rate 20, height 5\' 7"  (1.702 m), weight 76.658 kg (169 lb), SpO2 97 %.  PHYSICAL EXAMINATION:  GENERAL:  60 y.o.-year-old patient lying in the bed with no acute distress.  EYES: Pupils equal, round, reactive to light and accommodation. No scleral icterus. Extraocular muscles intact.  HEENT: Head atraumatic, normocephalic. Oropharynx and nasopharynx clear.  NECK:  Supple, no jugular venous distention. No thyroid enlargement, no tenderness.  LUNGS: Normal breath sounds bilaterally, no wheezing, right-sided crackles. No use of accessory muscles of respiration.  CARDIOVASCULAR: S1, S2 normal. No murmurs, rubs, or gallops.  ABDOMEN: Soft, nontender, nondistended. Bowel sounds present. No organomegaly or mass.  EXTREMITIES: No pedal edema, cyanosis, or clubbing.  NEUROLOGIC: Cranial nerves  II through XII are intact. Muscle strength 5/5 in all extremities. Sensation intact. Gait not checked.  PSYCHIATRIC: The patient is alert and oriented x 3.  SKIN: No obvious rash, lesion, or ulcer.    LABORATORY PANEL:   CBC  Recent Labs Lab 04/14/16 1018  WBC 14.0*  HGB 14.3  HCT 42.4  PLT 206   ------------------------------------------------------------------------------------------------------------------  Chemistries   Recent Labs Lab 04/14/16 1018  NA 140  K 3.3*  CL 105  CO2 24  GLUCOSE 124*  BUN 15  CREATININE 0.95  CALCIUM 9.5  AST 23  ALT 19  ALKPHOS 71  BILITOT 1.0   ------------------------------------------------------------------------------------------------------------------  Cardiac Enzymes No results for input(s): TROPONINI in the last 168 hours. ------------------------------------------------------------------------------------------------------------------  RADIOLOGY:  Dg Chest 2 View  04/14/2016  CLINICAL DATA:  Cough with with Short of breath. Right chest pain and wheezing EXAM: CHEST  2 VIEW COMPARISON:  None. FINDINGS: Right upper lobe infiltrate consistent with pneumonia. There is a patchy area of right lower lobe infiltrate all also consistent with pneumonia. No significant pleural effusion Left lung is clear. Mild cardiac enlargement without heart failure. IMPRESSION: Right upper lobe and right lower lobe pneumonia. No pleural effusion. Electronically Signed   By: Marlan Palauharles  Clark M.D.   On: 04/14/2016 11:40    EKG:   Orders placed or performed during the hospital encounter of 04/14/16  . EKG 12-Lead  . EKG 12-Lead    ASSESSMENT AND PLAN:   60 year old African-American female without significant medical history presenting with shortness of breath  1.Sepsis with community-acquired pneumonia  Continue Levaquin, follow-up blood culture and sputum culture. DuoNeb when necessary. Follow-up CBC.  2. Hypokalemia. Given potassium and  follow-up BMP.  3. Tobacco  abuse: Counseled by Dr. Clint Guy.  All the records are reviewed and case discussed with Care Management/Social Workerr. Management plans discussed with the patient, her husband and they are in agreement.  CODE STATUS: Full code  TOTAL TIME TAKING CARE OF THIS PATIENT: 37 minutes.  Greater than 50% time was spent on coordination of care and face-to-face counseling.  POSSIBLE D/C IN 2 DAYS, DEPENDING ON CLINICAL CONDITION.   Shaune Pollack M.D on 04/15/2016 at 1:23 PM  Between 7am to 6pm - Pager - [phone]  After 6pm go to [email or web address] - password [email or web address] [facility]  [facility]  Office  [phone]  CC: Primary care physician; No primary care provider on file.

## 2016-04-16 ENCOUNTER — Encounter: Payer: Self-pay | Admitting: Radiology

## 2016-04-16 ENCOUNTER — Inpatient Hospital Stay: Payer: Managed Care, Other (non HMO)

## 2016-04-16 DIAGNOSIS — R0781 Pleurodynia: Secondary | ICD-10-CM

## 2016-04-16 DIAGNOSIS — J189 Pneumonia, unspecified organism: Secondary | ICD-10-CM

## 2016-04-16 DIAGNOSIS — D72829 Elevated white blood cell count, unspecified: Secondary | ICD-10-CM

## 2016-04-16 DIAGNOSIS — E876 Hypokalemia: Secondary | ICD-10-CM

## 2016-04-16 DIAGNOSIS — D649 Anemia, unspecified: Secondary | ICD-10-CM

## 2016-04-16 LAB — BASIC METABOLIC PANEL
Anion gap: 7 (ref 5–15)
CO2: 22 mmol/L (ref 22–32)
CREATININE: 0.76 mg/dL (ref 0.44–1.00)
Calcium: 8.7 mg/dL — ABNORMAL LOW (ref 8.9–10.3)
Chloride: 111 mmol/L (ref 101–111)
GFR calc Af Amer: >60 mL/min (ref >60)
GLUCOSE: 106 mg/dL — AB (ref 65–99)
Potassium: 3.7 mmol/L (ref 3.5–5.1)
SODIUM: 140 mmol/L (ref 135–145)

## 2016-04-16 LAB — CBC
HCT: 32.9 % — ABNORMAL LOW (ref 35.0–47.0)
Hemoglobin: 11.3 g/dL — ABNORMAL LOW (ref 12.0–16.0)
MCH: 31 pg (ref 26.0–34.0)
MCHC: 34.3 g/dL (ref 32.0–36.0)
MCV: 90.5 fL (ref 80.0–100.0)
PLATELETS: 228 10*3/uL (ref 150–440)
RBC: 3.64 MIL/uL — ABNORMAL LOW (ref 3.80–5.20)
RDW: 13 % (ref 11.5–14.5)
WBC: 8.3 10*3/uL (ref 3.6–11.0)

## 2016-04-16 LAB — HEMOGLOBIN A1C: Hgb A1c MFr Bld: 5.5 % (ref 4.0–6.0)

## 2016-04-16 MED ORDER — GUAIFENESIN ER 600 MG PO TB12: 600.0000 mg | ORAL_TABLET | Freq: Two times a day (BID) | ORAL | Status: DC

## 2016-04-16 MED ORDER — GUAIFENESIN-CODEINE 100-10 MG/5ML PO SOLN: 10.0000 mL | ORAL | Status: DC | PRN

## 2016-04-16 MED ORDER — OXYCODONE HCL 5 MG PO TABS: 5.0000 mg | ORAL_TABLET | ORAL | Status: DC | PRN

## 2016-04-16 MED ORDER — POTASSIUM CHLORIDE IN NACL 20-0.9 MEQ/L-% IV SOLN
INTRAVENOUS | Status: DC
  Administered 2016-04-16: 08:00:00 via INTRAVENOUS
  Filled 2016-04-16 (×2): qty 1000

## 2016-04-16 MED ORDER — HYDROCOD POLST-CPM POLST ER 10-8 MG/5ML PO SUER: 5.0000 mL | Freq: Two times a day (BID) | ORAL | Status: DC

## 2016-04-16 MED ORDER — IPRATROPIUM-ALBUTEROL 0.5-2.5 (3) MG/3ML IN SOLN
3.0000 mL | RESPIRATORY_TRACT | Status: DC
  Administered 2016-04-16: 12:00:00 3 mL via RESPIRATORY_TRACT
  Filled 2016-04-16: qty 3

## 2016-04-16 MED ORDER — IBUPROFEN 400 MG PO TABS
400.0000 mg | ORAL_TABLET | Freq: Four times a day (QID) | ORAL | Status: DC | PRN
  Filled 2016-04-16: qty 1

## 2016-04-16 MED ORDER — HYDROCOD POLST-CPM POLST ER 10-8 MG/5ML PO SUER
5.0000 mL | Freq: Two times a day (BID) | ORAL | Status: DC
Start: 2016-04-16 — End: 2016-04-16
  Administered 2016-04-16: 5 mL via ORAL
  Filled 2016-04-16: qty 5

## 2016-04-16 MED ORDER — IOPAMIDOL (ISOVUE-370) INJECTION 76%
75.0000 mL | Freq: Once | INTRAVENOUS | Status: AC | PRN
Start: 2016-04-16 — End: 2016-04-16
  Administered 2016-04-16: 08:00:00 75 mL via INTRAVENOUS

## 2016-04-16 MED ORDER — GUAIFENESIN ER 600 MG PO TB12
600.0000 mg | ORAL_TABLET | Freq: Two times a day (BID) | ORAL | Status: DC
  Administered 2016-04-16: 08:00:00 600 mg via ORAL
  Filled 2016-04-16: qty 1

## 2016-04-16 MED ORDER — NICOTINE 21 MG/24HR TD PT24: 21.0000 mg | MEDICATED_PATCH | Freq: Every day | TRANSDERMAL | Status: DC

## 2016-04-16 NOTE — Progress Notes (Signed)
MD order received to discharge pt home today; verbally reviewed AVS with pt including medications/gave Rxs to pt; diet; activity level and follow up appointment/pt to call primary care physician for appointment, gave pt a copy of physicians [place] benefits; gave pt progress note that included return to work note for her employer; no questions voiced at this time; pt discharged via wheelchair by nursing to the visitor's entrance

## 2016-04-16 NOTE — Care Management Note (Signed)
Case Management Note  Patient Details  Name: Brianna Gibson MRN: [id number] Date of Birth: December 28, 1955  Subjective/Objective:     [facility] but no PCP. This Clinical research associatewriter discussed with her that she should call the [employer] phone number on the back of her insurance to obtain a list of local MD providers in network with [employer].                Action/Plan:   Expected Discharge Date:                  Expected Discharge Plan:     In-House Referral:     Discharge planning Services     Post Acute Care Choice:    Choice offered to:     DME Arranged:    DME Agency:     HH Arranged:    HH Agency:     Status of Service:     Medicare Important Message Given:    Date Medicare IM Given:    Medicare IM give by:    Date Additional Medicare IM Given:    Additional Medicare Important Message give by:     If discussed at Long Length of Stay Meetings, dates discussed:    Additional Comments:  Rowdy Guerrini A, RN 04/16/2016, 10:55 AM

## 2016-04-16 NOTE — Progress Notes (Addendum)
[facility] [place] [facility]   PATIENT NAME: Brianna HarrisonCrystal Ficek    MR#:  [id number]  DATE OF BIRTH:  May 08, 1956  SUBJECTIVE:  CHIEF COMPLAINT:   Chief Complaint  Patient presents with  . Shortness of Breath   The patient is a 60 year old female Who presents to the hospital with Cough, sputum and shortness of breath. Right-sided chest pain while deep breathing. Pain is significant, poorly controlled, now on oxycodone as needed. Complains of shortness of breath and chest pains on the right side. No high fevers, temperature at around 99. Vital cell count has improved. sputum cultures are pending, blood cultures 2 are negative so far  REVIEW OF SYSTEMS:  CONSTITUTIONAL: No fever, Has poor appetite and generalized weakness.  EYES: No blurred or double vision.  EARS, NOSE, AND THROAT: No tinnitus or ear pain.  RESPIRATORY: has cough, shortness of breath, wheezing but no hemoptysis.  CARDIOVASCULAR:Right-sided pleuritic chest  pain, . No orthopnea, edema.  GASTROINTESTINAL: No nausea, vomiting, diarrhea or abdominal pain.  GENITOURINARY: No dysuria, hematuria.  ENDOCRINE: No polyuria, nocturia,  HEMATOLOGY: No anemia, easy bruising or bleeding SKIN: No rash or lesion. MUSCULOSKELETAL: No joint pain or arthritis.   NEUROLOGIC: No tingling, numbness, weakness.  PSYCHIATRY: No anxiety or depression.   DRUG ALLERGIES:  No Known Allergies  VITALS:  Blood pressure 131/83, pulse 77, temperature 99.3 F (37.4 C), temperature source Oral, resp. rate 18, height 5\' 7"  (1.702 m), weight 76.023 kg (167 lb 9.6 oz), SpO2 93 %.  PHYSICAL EXAMINATION:  GENERAL:  60 y.o.-year-old patient lying in the bed in Moderate acute  distress, Intermittently coughing and holding her right side of the chest due to significant pain, short of breath, especially with movements or talking to me.  EYES: Pupils equal, round, reactive to light and accommodation. No scleral icterus. Extraocular  muscles intact.  HEENT: Head atraumatic, normocephalic. Oropharynx and nasopharynx clear.  NECK:  Supple, no jugular venous distention. No thyroid enlargement, no tenderness.  LUNGS:Moderately diminished right-sided breath  sounds , normal on the left, no wheezing,  right-side is  dull to percussion two thirds of lower lung field.  Intermittently using  accessory muscles of respiration.  CARDIOVASCULAR: S1, S2 normal. No murmurs, rubs, or gallops.  ABDOMEN: Soft, nontender, nondistended. Bowel sounds present. No organomegaly or mass.  EXTREMITIES: No pedal edema, cyanosis, or clubbing.  NEUROLOGIC: Cranial nerves II through XII are intact. Muscle strength 5/5 in all extremities. Sensation intact. Gait not checked.  PSYCHIATRIC: The patient is alert and oriented x 3.  SKIN: No obvious rash, lesion, or ulcer.    LABORATORY PANEL:   CBC  Recent Labs Lab 04/16/16 0428  WBC 8.3  HGB 11.3*  HCT 32.9*  PLT 228   ------------------------------------------------------------------------------------------------------------------  Chemistries   Recent Labs Lab 04/14/16 1018 04/16/16 0428  NA 140 140  K 3.3* 3.7  CL 105 111  CO2 24 22  GLUCOSE 124* 106*  BUN 15 <5*  CREATININE 0.95 0.76  CALCIUM 9.5 8.7*  AST 23  --   ALT 19  --   ALKPHOS 71  --   BILITOT 1.0  --    ------------------------------------------------------------------------------------------------------------------  Cardiac Enzymes No results for input(s): TROPONINI in the last 168 hours. ------------------------------------------------------------------------------------------------------------------  RADIOLOGY:  Dg Chest 2 View  04/14/2016  CLINICAL DATA:  Cough with with Short of breath. Right chest pain and wheezing EXAM: CHEST  2 VIEW COMPARISON:  None. FINDINGS: Right upper lobe infiltrate consistent with pneumonia. There is  a patchy area of right lower lobe infiltrate all also consistent with pneumonia. No  significant pleural effusion Left lung is clear. Mild cardiac enlargement without heart failure. IMPRESSION: Right upper lobe and right lower lobe pneumonia. No pleural effusion. Electronically Signed   By: Marlan Palau M.D.   On: 04/14/2016 11:40   Ct Angio Chest Pe W/cm &/or Wo Cm  04/16/2016  CLINICAL DATA:  Right-sided chest pain with inspiration. Cough. Symptoms for 2 weeks. Patient is on antibiotics. Positive tobacco use. No history of surgery or cancer in the thorax. EXAM: CT ANGIOGRAPHY CHEST WITH CONTRAST TECHNIQUE: Multidetector CT imaging of the chest was performed using the standard protocol during bolus administration of intravenous contrast. Multiplanar CT image reconstructions and MIPs were obtained to evaluate the vascular anatomy. CONTRAST:  75 cc Isovue 370 COMPARISON:  CT abdomen 05/07/2013 FINDINGS: There are no filling defects in the pulmonary arterial tree to suggest acute pulmonary thromboembolism. Minimal atherosclerotic calcification in the aortic arch and proximal great vessels. Tiny pericardial effusion. No abnormal mediastinal adenopathy. Small prevascular nodes. Thyroid is unremarkable. No pneumothorax. No left pleural effusion. Tiny right pleural effusion. Heterogeneous opacities in the dependent right upper and lower lobes. Bronchial wall thickening is present in the associated lung zones. These findings suggest bronchial inflammatory change and aspiration pneumonia. There is also emphysema towards the lung apices. Left lung is grossly clear. No acute fracture. Calcified granuloma in the spleen. Review of the MIP images confirms the above findings. IMPRESSION: No evidence of acute pulmonary thromboembolism Patchy airspace disease in the right upper and lower lobes an aspiration pattern. There is associated bronchial wall thickening due to inflammation. Tiny pericardial and right pleural effusions. Electronically Signed   By: Jolaine Click M.D.   On: 04/16/2016 08:57    EKG:    Orders placed or performed during the hospital encounter of 04/14/16  . EKG 12-Lead  . EKG 12-Lead    ASSESSMENT AND PLAN:   60 year old African-American female without significant medical history presenting with shortness of breath  1.Sepsis due to right upper and lower lobe pneumonia , clinically worsened, although patient as well as her count has improved. I ordered a CT scan of the chest to assess for pleural effusion/empyema. Continue Levaquin, Negative blood cultures   so far and pending sputum culture.  continue patient on DuoNeb when necessary. CBC revealed improvement of white blood cell count.Patient's oxygen requirement has worsened, may need to initiate oxygen therapy per nasal cannulas. Added Tussionex and Humibid to alleviate discomfort.  2. Hypokalemia. Given potassium, potassium level normalized,  follow-up BMP Intermittently.  3. Tobacco abuse: Counseled by Dr. Clint Guy, Nicotine replacement therapy is initiated. 4. Leukocytosis, improved with antibiotic therapy. 5. Right-sided pleuritic chest pain, getting CT scan of the chest evaluate for pleural effusion/empyema, initiate patient on ibuprofen, oxycodone as needed, follow clinically  6. Anemia. With rehydration, get Hemoccult, follow patient's hemoglobin level tomorrow morning  All the records are reviewed and case discussed with Care Management/Social Workerr. Management plans discussed with the patient, her husband and they are in agreement.  CODE STATUS: Full code  TOTAL TIME TAKING CARE OF THIS PATIENT: 35 minutes.  Greater than 50% time was spent on coordination of care and face-to-face counseling.  PS. I discussed with the patient her  CT scan results and she stated that she would like to go home with follow-up as outpatient. The patient needs to establish primary care physician, to be followed up as outpatient, we will request social work involvement  in helping patient to find primary care physician. Patient feels  comfortable going home.     Katharina Caper M.D on 04/16/2016 at 10:34 AM  Between 7am to 6pm - Pager - [phone]  After 6pm go to [email or web address] - password [email or web address]  [facility]  Office  [phone]  CC: Primary care physician; No primary care provider on file.

## 2016-04-16 NOTE — Progress Notes (Signed)
          To Whom It May Concern:        Mrs. Brianna HarrisonCrystal Gibson was hospitalized at [facility] with serious disease on 04/14/2016, discharged on 04/16/2016. She is to return to work on 04/25/2016 or later depending on her primary care physician's recommendations. Thank you for your understanding.        Sincerely,   Katharina Caperima Laurence Folz, MD .

## 2016-04-17 LAB — CULTURE, RESPIRATORY: CULTURE: NORMAL

## 2016-04-17 LAB — EXPECTORATED SPUTUM ASSESSMENT W GRAM STAIN, RFLX TO RESP C

## 2016-04-17 LAB — EXPECTORATED SPUTUM ASSESSMENT W REFEX TO RESP CULTURE

## 2016-04-19 LAB — CULTURE, BLOOD (ROUTINE X 2)
CULTURE: NO GROWTH
Culture: NO GROWTH

## 2016-05-01 NOTE — Discharge Summary (Signed)
[facility] [place] [facility]   PATIENT NAME: Brianna Gibson    MR#:  [id number]  DATE OF BIRTH:  11/10/1956  DATE OF ADMISSION:  04/14/2016 ADMITTING PHYSICIAN: Wyatt Hasteavid K Hower, MD  DATE OF DISCHARGE: 04/16/2016  1:25 PM  PRIMARY CARE PHYSICIAN: No primary care provider on file.     ADMISSION DIAGNOSIS:  Community acquired pneumonia [J18.9]  DISCHARGE DIAGNOSIS:  Principal Problem:   Sepsis (HCC) Active Problems:   Pneumonia   Hypokalemia   Leukocytosis   Pleuritic chest pain   Anemia   SECONDARY DIAGNOSIS:  History reviewed. No pertinent past medical history.  .pro HOSPITAL COURSE:  The patient is a 60 year old female Who presented to the hospital with Cough, sputum and shortness of breath,  Right-sided chest pain with deep breathing. Patient had chest x-ray done on admission 27, self April 2017 revealing right upper lobe and right lower lobe pneumonia, no pleural effusions were noted. Patient had CT scan of the chest, showing patchy airspace disease in the right upper and lower lobes and aspiration pattern, associated bronchial wall thickening due to inflammation. Tiny right pleural effusion was also noted. Patient was initiated on broad-spectrum antibiotic therapy, however, blood cultures remained negative and sputum cultures revealed normal respiratory flora, it was felt that patient's condition was related to aspiration, antibiotics were stopped. Patient's pain resolved with pain medications, anti-inflammatory medications. Patient was advised to continue cough suppressants, pain medications as needed and  follow up with primary care physician Discussion by problem: 1.Sepsis due to right upper and lower lobe pneumonitis, likely aspiration, sputum cultures revealed normal respiratory flora, but blood cultures remained negative.  CT scan of the chest revealed no significant pleural effusion, presentation was not consistent with empyema. Antibiotic was  stopped. Patient is to continue Tussionex and Humibid to alleviate discomfort, pain medications, anti-inflammatory medications. Patient is to follow-up with primary care physician for further recommendations.Cliffton Asters. White blood cell count normalized with therapy 2. Hypokalemia. Given potassium, potassium level normalized, follow-up BMP Intermittently as outpatient.  3. Tobacco abuse: Counseled by Dr. Clint GuyHower, Nicotine replacement therapy is to be continued as outpatient. 4. Leukocytosis, resolved with antibiotic therapy. 5. Right-sided pleuritic chest pain, CT scan of the chest is not consistent with empyema, continue patient on ibuprofen, oxycodone as needed, improved clinically  6. Anemia. With rehydration, Hemoccult was ordered, however, not receive during this admission, follow patient's hemoglobin level as outpatient, no active bleeding was noted. 7. Hyperglycemia, hemoglobin level of 5.5, no diabetes. DISCHARGE CONDITIONS:   Stable  CONSULTS OBTAINED:  Treatment Team:  Wyatt Hasteavid K Hower, MD  DRUG ALLERGIES:  No Known Allergies  DISCHARGE MEDICATIONS:   Discharge Medication List as of 04/16/2016 11:31 AM    START taking these medications   Details  chlorpheniramine-HYDROcodone (TUSSIONEX) 10-8 MG/5ML SUER Take 5 mLs by mouth every 12 (twelve) hours., Starting 04/16/2016, Until Discontinued, Normal    guaiFENesin (MUCINEX) 600 MG 12 hr tablet Take 1 tablet (600 mg total) by mouth 2 (two) times daily., Starting 04/16/2016, Until Discontinued, Normal    guaiFENesin-codeine 100-10 MG/5ML syrup Take 10 mLs by mouth every 4 (four) hours as needed for cough., Starting 04/16/2016, Until Discontinued, Print    nicotine (NICODERM CQ - DOSED IN MG/24 HOURS) 21 mg/24hr patch Place 1 patch (21 mg total) onto the skin daily., Starting 04/16/2016, Until Discontinued, Normal    oxyCODONE (OXY IR/ROXICODONE) 5 MG immediate release tablet Take 1 tablet (5 mg total) by mouth every 4 (four) hours as needed  for  moderate pain., Starting 04/16/2016, Until Discontinued, Print      CONTINUE these medications which have NOT CHANGED   Details  ibuprofen (ADVIL,MOTRIN) 200 MG tablet Take 400 mg by mouth every 6 (six) hours as needed., Until Discontinued, Historical Med         DISCHARGE INSTRUCTIONS:    Patient is to follow-up with primary care physician  If you experience worsening of your admission symptoms, develop shortness of breath, life threatening emergency, suicidal or homicidal thoughts you must seek medical attention immediately by calling 911 or calling your MD immediately  if symptoms less severe.  You Must read complete instructions/literature along with all the possible adverse reactions/side effects for all the Medicines you take and that have been prescribed to you. Take any new Medicines after you have completely understood and accept all the possible adverse reactions/side effects.   Please note  You were cared for by a hospitalist during your hospital stay. If you have any questions about your discharge medications or the care you received while you were in the hospital after you are discharged, you can call the unit and asked to speak with the hospitalist on call if the hospitalist that took care of you is not available. Once you are discharged, your primary care physician will handle any further medical issues. Please note that NO REFILLS for any discharge medications will be authorized once you are discharged, as it is imperative that you return to your primary care physician (or establish a relationship with a primary care physician if you do not have one) for your aftercare needs so that they can reassess your need for medications and monitor your lab values.    Today   CHIEF COMPLAINT:   Chief Complaint  Patient presents with  . Shortness of Breath    HISTORY OF PRESENT ILLNESS:  Brianna Gibson  is a 60 y.o. female with a not significant past medical history who presented  to the hospital with Cough, sputum and shortness of breath,  Right-sided chest pain with deep breathing. Patient had chest x-ray done on admission 27, self April 2017 revealing right upper lobe and right lower lobe pneumonia, no pleural effusions were noted. Patient had CT scan of the chest, showing patchy airspace disease in the right upper and lower lobes and aspiration pattern, associated bronchial wall thickening due to inflammation. Tiny right pleural effusion was also noted. Patient was initiated on broad-spectrum antibiotic therapy, however, blood cultures remained negative and sputum cultures revealed normal respiratory flora, it was felt that patient's condition was related to aspiration, antibiotics were stopped. Patient's pain resolved with pain medications, anti-inflammatory medications. Patient was advised to continue cough suppressants, pain medications as needed and  follow up with primary care physician Discussion by problem: 1.Sepsis due to right upper and lower lobe pneumonitis, likely aspiration, sputum cultures revealed normal respiratory flora, but blood cultures remained negative.  CT scan of the chest revealed no significant pleural effusion, presentation was not consistent with empyema. Antibiotic was stopped. Patient is to continue Tussionex and Humibid to alleviate discomfort, pain medications, anti-inflammatory medications. Patient is to follow-up with primary care physician for further recommendations.Cliffton Asters blood cell count normalized with therapy 2. Hypokalemia. Given potassium, potassium level normalized, follow-up BMP Intermittently as outpatient.  3. Tobacco abuse: Counseled by Dr. Clint Guy, Nicotine replacement therapy is to be continued as outpatient. 4. Leukocytosis, resolved with antibiotic therapy. 5. Right-sided pleuritic chest pain, CT scan of the chest is not consistent with empyema, continue  patient on ibuprofen, oxycodone as needed, improved clinically  6. Anemia.  With rehydration, Hemoccult was ordered, however, not receive during this admission, follow patient's hemoglobin level as outpatient, no active bleeding was noted. 7. Hyperglycemia, hemoglobin level of 5.5, no diabetes. VITAL SIGNS:  Blood pressure 131/83, pulse 77, temperature 99.3 F (37.4 C), temperature source Oral, resp. rate 18, height  (1.702 m), weight 76.023 kg (167 lb 9.6 oz), SpO2 94 %.  I/O:  No intake or output data in the 24 hours ending 05/01/16 1424  PHYSICAL EXAMINATION:  GENERAL:  60 y.o.-year-old patient lying in the bed with no acute distress.  EYES: Pupils equal, round, reactive to light and accommodation. No scleral icterus. Extraocular muscles intact.  HEENT: Head atraumatic, normocephalic. Oropharynx and nasopharynx clear.  NECK:  Supple, no jugular venous distention. No thyroid enlargement, no tenderness.  LUNGS: Normal breath sounds bilaterally, no wheezing, rales,rhonchi or crepitation. No use of accessory muscles of respiration.  CARDIOVASCULAR: S1, S2 normal. No murmurs, rubs, or gallops.  ABDOMEN: Soft, non-tender, non-distended. Bowel sounds present. No organomegaly or mass.  EXTREMITIES: No pedal edema, cyanosis, or clubbing.  NEUROLOGIC: Cranial nerves II through XII are intact. Muscle strength 5/5 in all extremities. Sensation intact. Gait not checked.  PSYCHIATRIC: The patient is alert and oriented x 3.  SKIN: No obvious rash, lesion, or ulcer.   DATA REVIEW:   CBC No results for input(s): WBC, HGB, HCT, PLT in the last 168 hours.  Chemistries  No results for input(s): NA, K, CL, CO2, GLUCOSE, BUN, CREATININE, CALCIUM, MG, AST, ALT, ALKPHOS, BILITOT in the last 168 hours.  Invalid input(s): GFRCGP  Cardiac Enzymes No results for input(s): TROPONINI in the last 168 hours.  Microbiology Results  Results for orders placed or performed during the hospital encounter of 04/14/16  CULTURE, BLOOD (ROUTINE X 2) w Reflex to PCR ID Panel     Status:  None   Collection Time: 04/14/16  2:23 PM  Result Value Ref Range Status   Specimen Description BLOOD LEFT ARM  Final   Special Requests BOTTLES DRAWN AEROBIC AND ANAEROBIC 2CCAERO,2CCANA  Final   Culture NO GROWTH 5 DAYS  Final   Report Status 04/19/2016 FINAL  Final  CULTURE, BLOOD (ROUTINE X 2) w Reflex to PCR ID Panel     Status: None   Collection Time: 04/14/16  2:23 PM  Result Value Ref Range Status   Specimen Description BLOOD RIGHT ASSIST CONTROL  Final   Special Requests BOTTLES DRAWN AEROBIC AND ANAEROBIC 2CCAERO,1CCANA  Final   Culture NO GROWTH 5 DAYS  Final   Report Status 04/19/2016 FINAL  Final  Culture, expectorated sputum-assessment     Status: None   Collection Time: 04/15/16  6:25 AM  Result Value Ref Range Status   Specimen Description SPUTUM  Final   Special Requests NONE  Final   Sputum evaluation THIS SPECIMEN IS ACCEPTABLE FOR SPUTUM CULTURE  Final   Report Status 04/17/2016 FINAL  Final  Culture, respiratory (NON-Expectorated)     Status: None   Collection Time: 04/15/16  6:25 AM  Result Value Ref Range Status   Specimen Description SPUTUM  Final   Special Requests NONE Reflexed from [id number]  Final   Gram Stain   Final    MODERATE WBC SEEN RARE GRAM NEGATIVE RODS RARE GRAM POSITIVE COCCI IN PAIRS    Culture Consistent with normal respiratory flora.  Final   Report Status 04/17/2016 FINAL  Final    RADIOLOGY:  No  results found.  EKG:   Orders placed or performed during the hospital encounter of 04/14/16  . EKG 12-Lead  . EKG 12-Lead  . EKG      Management plans discussed with the patient, family and they are in agreement.  CODE STATUS:  Code Status History    Date Active Date Inactive Code Status Order ID Comments User Context   04/14/2016  1:35 PM 04/16/2016  4:51 PM Full Code [id number]  Wyatt Haste, MD ED      TOTAL TIME TAKING CARE OF THIS PATIENT: 40 minutes.    Katharina Caper M.D on 05/01/2016 at 2:24 PM  Between 7am to 6pm -  Pager - [phone]  After 6pm go to [email or web address] - password [email or web address] [facility]  [place] [facility]  Office  [phone]  CC: Primary care physician; No primary care provider on file.

## 2016-05-27 ENCOUNTER — Other Ambulatory Visit: Payer: Self-pay | Admitting: Adult Health

## 2016-05-27 DIAGNOSIS — Z1231 Encounter for screening mammogram for malignant neoplasm of breast: Secondary | ICD-10-CM

## 2016-06-14 ENCOUNTER — Other Ambulatory Visit: Payer: Self-pay | Admitting: Adult Health

## 2016-06-14 ENCOUNTER — Ambulatory Visit
Admission: RE | Admit: 2016-06-14 | Discharge: 2016-06-14 | Disposition: A | Discharge status: Home / Self Care | Payer: Managed Care, Other (non HMO) | Source: Ambulatory Visit | Attending: Adult Health | Admitting: Adult Health

## 2016-06-14 DIAGNOSIS — Z1231 Encounter for screening mammogram for malignant neoplasm of breast: Secondary | ICD-10-CM | POA: Insufficient documentation

## 2017-05-17 ENCOUNTER — Other Ambulatory Visit: Payer: Self-pay | Admitting: Student

## 2017-05-17 DIAGNOSIS — Z1231 Encounter for screening mammogram for malignant neoplasm of breast: Secondary | ICD-10-CM

## 2017-05-27 IMAGING — CR DG CHEST 2V
1 series · 2 of 2 positions shown · non-contrast
Comparison: None.

CLINICAL DATA: Cough with with Short of breath. Right chest pain
and wheezing

EXAM:
CHEST  2 VIEW

[Series 1: dg chest 2 view · 0.14mm/px · 2 of 2 slices shown]
[im 1/2]
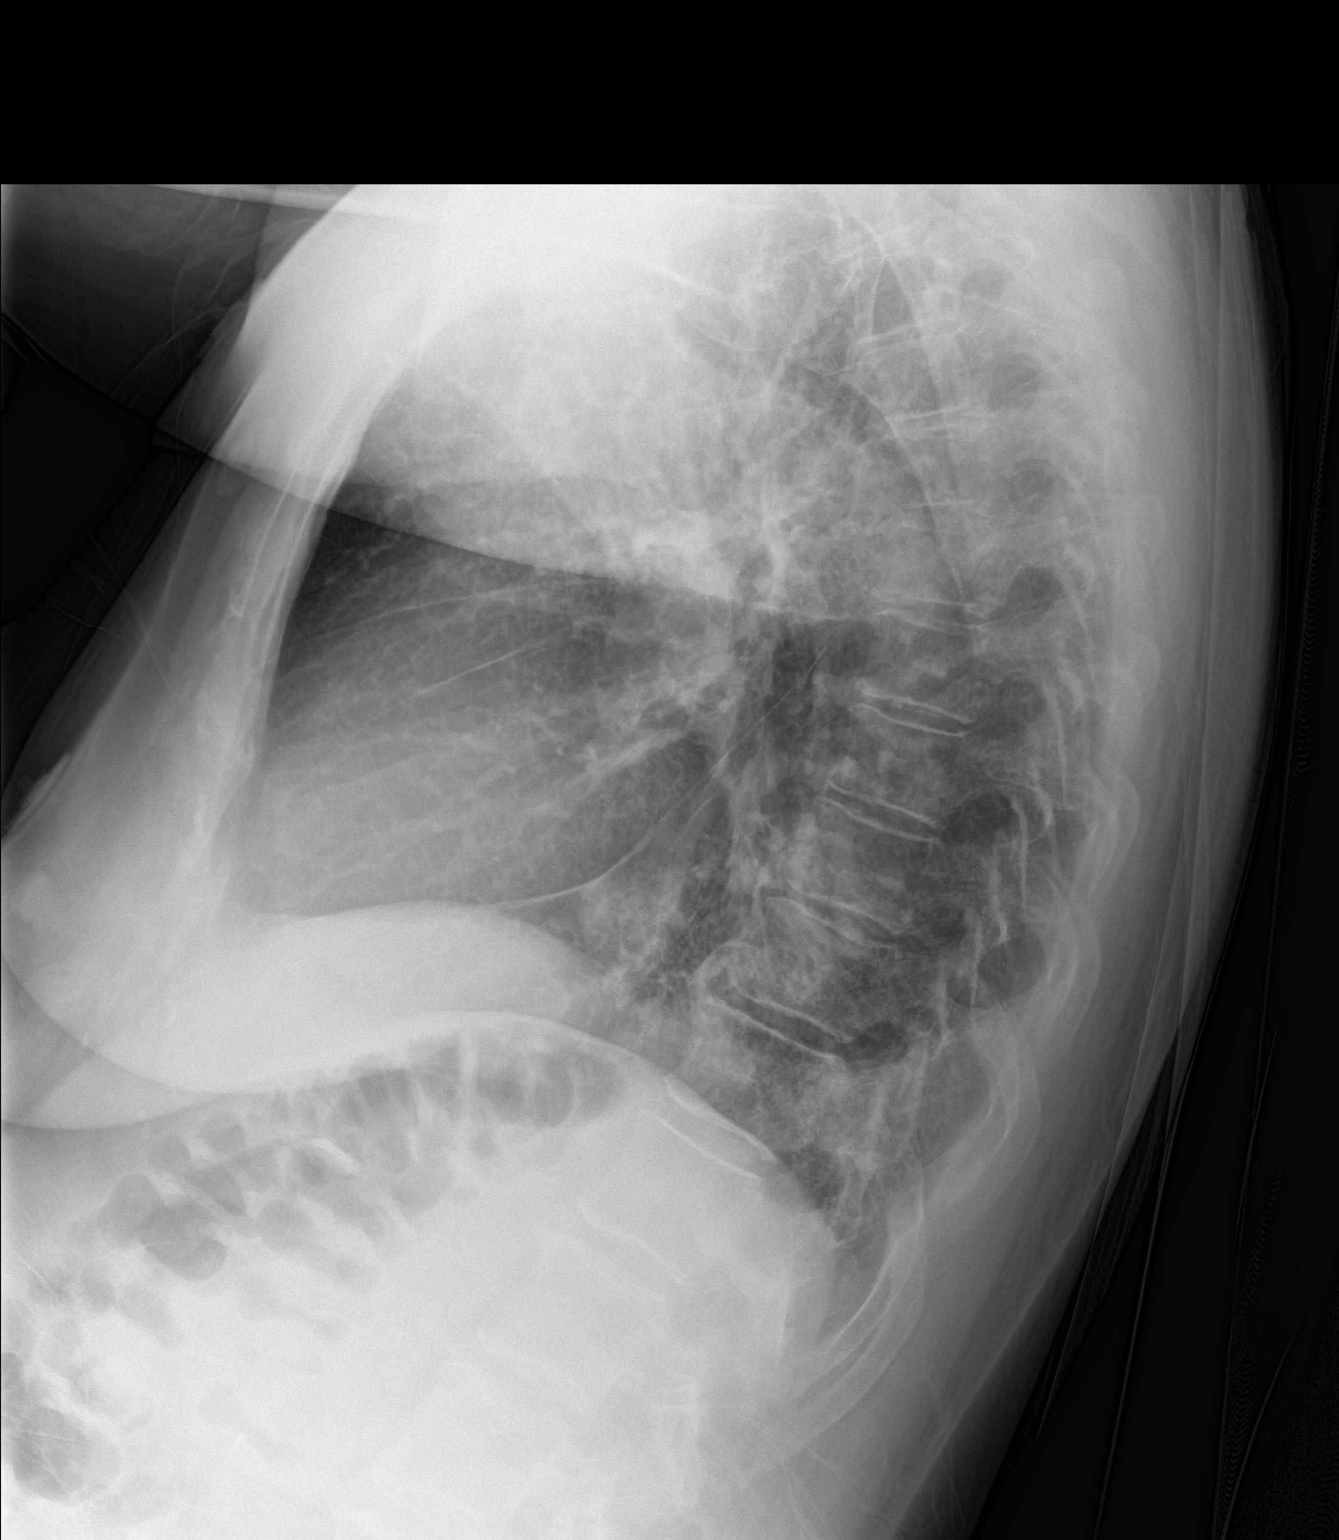
[im 2/2]
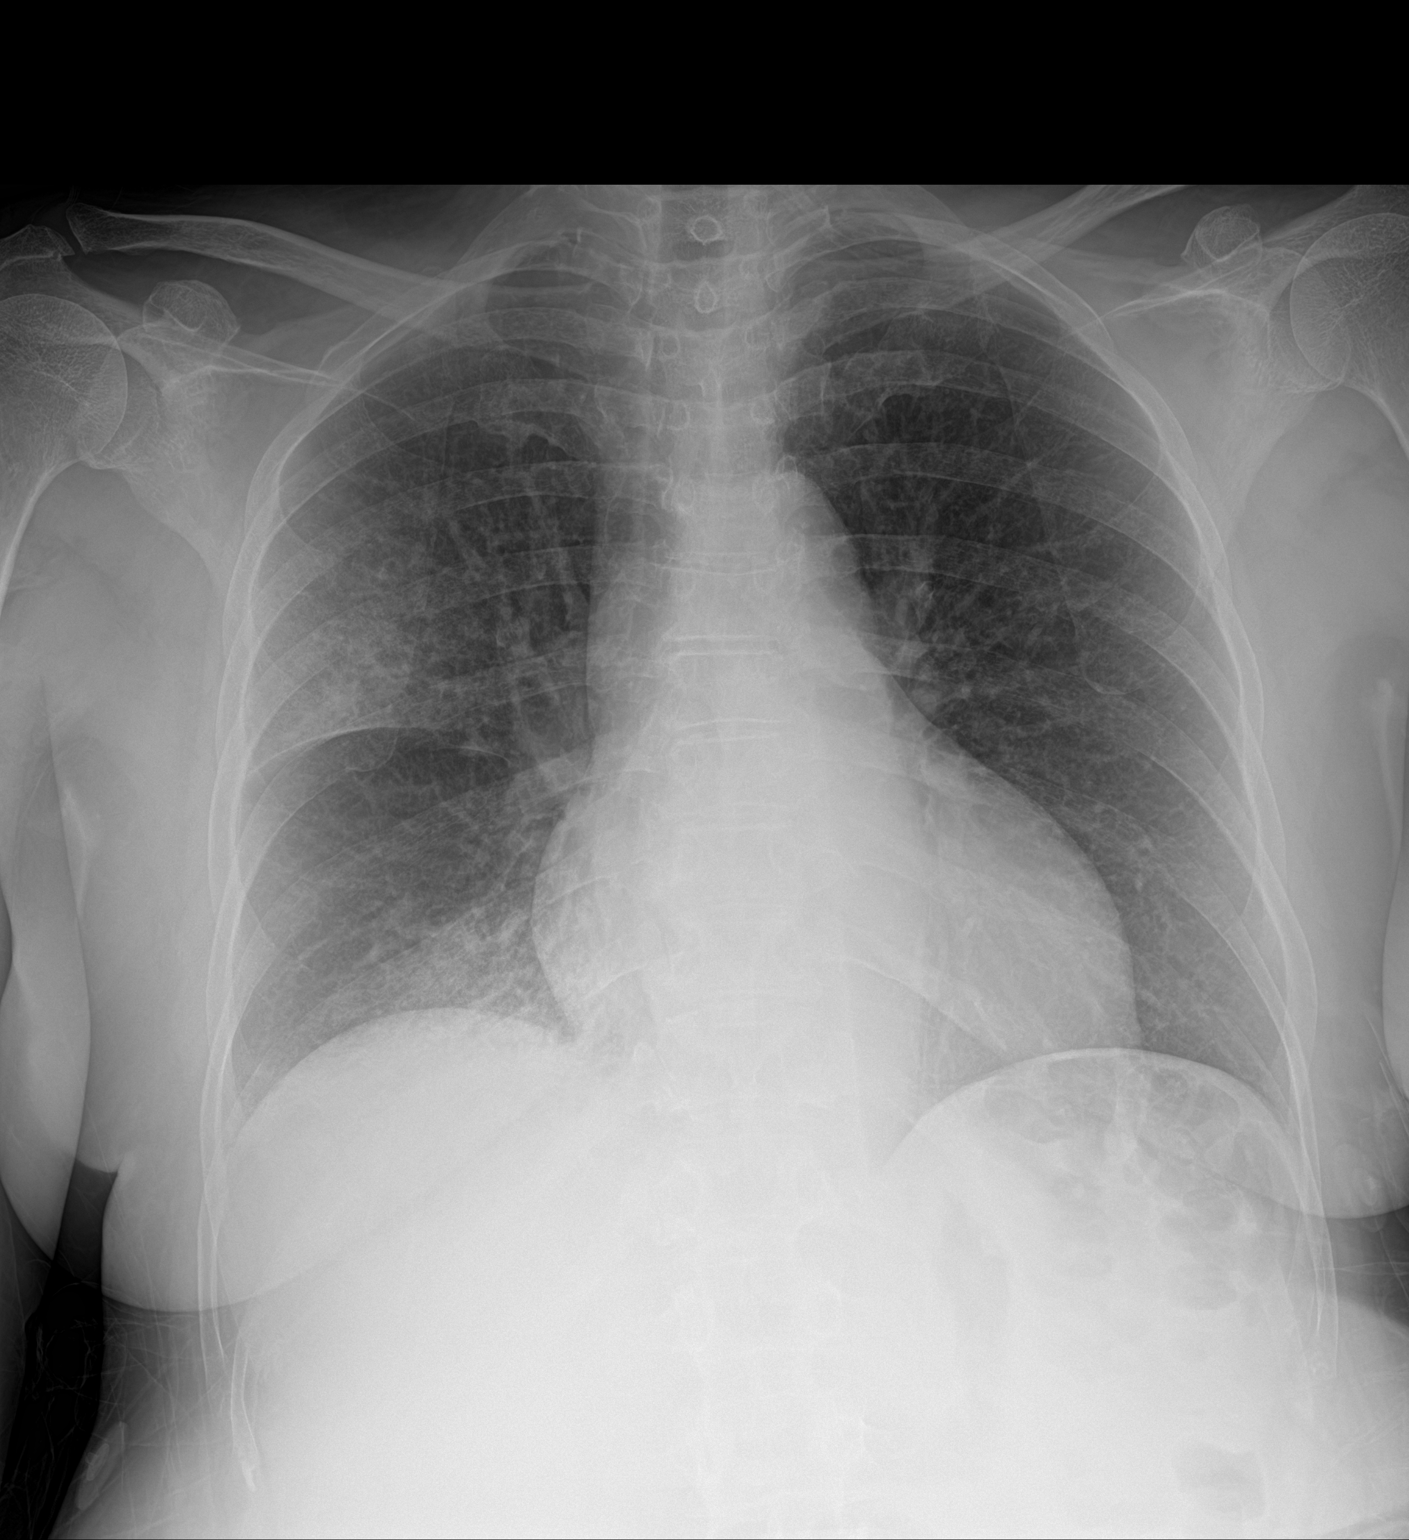

[2 of 2 positions shown; findings below may reference images not displayed]

FINDINGS: Right upper lobe infiltrate consistent with pneumonia. There is a
patchy area of right lower lobe infiltrate all also consistent with
pneumonia. No significant pleural effusion

Left lung is clear.

Mild cardiac enlargement without heart failure.
IMPRESSION: Right upper lobe and right lower lobe pneumonia. No pleural
effusion.

## 2017-07-28 ENCOUNTER — Ambulatory Visit
Admission: RE | Admit: 2017-07-28 | Discharge: 2017-07-28 | Disposition: A | Discharge status: Home / Self Care | Payer: Commercial Managed Care - PPO | Source: Ambulatory Visit | Attending: Student | Admitting: Student

## 2017-07-28 DIAGNOSIS — Z1231 Encounter for screening mammogram for malignant neoplasm of breast: Secondary | ICD-10-CM | POA: Diagnosis not present

## 2018-06-18 HISTORY — PX: OTHER SURGICAL HISTORY: SHX169

## 2018-06-26 ENCOUNTER — Other Ambulatory Visit: Payer: Self-pay | Admitting: Student

## 2018-06-26 DIAGNOSIS — Z1231 Encounter for screening mammogram for malignant neoplasm of breast: Secondary | ICD-10-CM

## 2018-07-18 HISTORY — PX: TRANSTHORACIC ECHOCARDIOGRAM: SHX275

## 2018-08-01 ENCOUNTER — Ambulatory Visit
Admission: RE | Admit: 2018-08-01 | Discharge: 2018-08-01 | Disposition: A | Discharge status: Home / Self Care | Payer: Commercial Managed Care - PPO | Source: Ambulatory Visit | Attending: Student | Admitting: Student

## 2018-08-01 DIAGNOSIS — Z1231 Encounter for screening mammogram for malignant neoplasm of breast: Secondary | ICD-10-CM | POA: Insufficient documentation

## 2019-06-27 ENCOUNTER — Other Ambulatory Visit: Payer: Self-pay | Admitting: Student

## 2019-06-27 DIAGNOSIS — Z1231 Encounter for screening mammogram for malignant neoplasm of breast: Secondary | ICD-10-CM

## 2019-08-05 ENCOUNTER — Ambulatory Visit
Admission: RE | Admit: 2019-08-05 | Discharge: 2019-08-05 | Disposition: A | Discharge status: Home / Self Care | Payer: Commercial Managed Care - PPO | Source: Ambulatory Visit | Attending: Student | Admitting: Student

## 2019-08-05 DIAGNOSIS — Z1231 Encounter for screening mammogram for malignant neoplasm of breast: Secondary | ICD-10-CM | POA: Diagnosis not present

## 2021-12-10 ENCOUNTER — Other Ambulatory Visit: Payer: Self-pay | Admitting: Family Medicine

## 2021-12-10 DIAGNOSIS — Z1231 Encounter for screening mammogram for malignant neoplasm of breast: Secondary | ICD-10-CM

## 2021-12-29 ENCOUNTER — Other Ambulatory Visit: Payer: Self-pay | Admitting: Physician Assistant

## 2021-12-29 DIAGNOSIS — J449 Chronic obstructive pulmonary disease, unspecified: Secondary | ICD-10-CM

## 2021-12-29 DIAGNOSIS — R042 Hemoptysis: Secondary | ICD-10-CM

## 2021-12-29 DIAGNOSIS — R051 Acute cough: Secondary | ICD-10-CM

## 2021-12-30 ENCOUNTER — Ambulatory Visit
Admission: RE | Admit: 2021-12-30 | Discharge: 2021-12-30 | Disposition: A | Discharge status: Home / Self Care | Payer: Medicare Other | Source: Ambulatory Visit | Attending: Family Medicine | Admitting: Family Medicine

## 2021-12-30 ENCOUNTER — Other Ambulatory Visit: Payer: Self-pay

## 2021-12-30 ENCOUNTER — Ambulatory Visit
Admission: RE | Admit: 2021-12-30 | Discharge: 2021-12-30 | Disposition: A | Discharge status: Home / Self Care | Payer: Medicare Other | Source: Ambulatory Visit | Attending: Physician Assistant | Admitting: Physician Assistant

## 2021-12-30 DIAGNOSIS — R051 Acute cough: Secondary | ICD-10-CM | POA: Diagnosis present

## 2021-12-30 DIAGNOSIS — R042 Hemoptysis: Secondary | ICD-10-CM

## 2021-12-30 DIAGNOSIS — J449 Chronic obstructive pulmonary disease, unspecified: Secondary | ICD-10-CM | POA: Insufficient documentation

## 2022-01-20 ENCOUNTER — Ambulatory Visit (INDEPENDENT_AMBULATORY_CARE_PROVIDER_SITE_OTHER): Payer: Medicare Other

## 2022-01-20 ENCOUNTER — Other Ambulatory Visit: Payer: Self-pay

## 2022-01-20 ENCOUNTER — Ambulatory Visit: Payer: Medicare Other | Admitting: Cardiology

## 2022-01-20 ENCOUNTER — Encounter: Payer: Self-pay | Admitting: Cardiology

## 2022-01-20 VITALS — BP 124/78 | HR 88 | Ht 67.0 in | Wt 176.0 lb

## 2022-01-20 DIAGNOSIS — J449 Chronic obstructive pulmonary disease, unspecified: Secondary | ICD-10-CM | POA: Diagnosis not present

## 2022-01-20 DIAGNOSIS — R0609 Other forms of dyspnea: Secondary | ICD-10-CM | POA: Diagnosis not present

## 2022-01-20 DIAGNOSIS — R0781 Pleurodynia: Secondary | ICD-10-CM

## 2022-01-20 DIAGNOSIS — I479 Paroxysmal tachycardia, unspecified: Secondary | ICD-10-CM

## 2022-01-20 DIAGNOSIS — I340 Nonrheumatic mitral (valve) insufficiency: Secondary | ICD-10-CM | POA: Insufficient documentation

## 2022-01-20 DIAGNOSIS — I272 Pulmonary hypertension, unspecified: Secondary | ICD-10-CM

## 2022-01-20 NOTE — Patient Instructions (Signed)
Medication Instructions:   Your physician recommends that you continue on your current medications as directed. Please refer to the Current Medication list given to you today.   *If you need a refill on your cardiac medications before your next appointment, please call your pharmacy*   Lab Work:  None ordered  If you have labs (blood work) drawn today and your tests are completely normal, you will receive your results only by: [place] (if you have MyChart) OR A paper copy in the mail If you have any lab test that is abnormal or we need to change your treatment, we will call you to review the results.   Testing/Procedures:  Echocardiogram - Your physician has requested that you have an echocardiogram in 1 month. Echocardiography is a painless test that uses sound waves to create images of your heart. It provides your doctor with information about the size and shape of your heart and how well your hearts chambers and valves are working. This procedure takes approximately one hour. There are no restrictions for this procedure.  2. Your provider has ordered a heart monitor to wear for 14 days. This will be mailed to your home with instructions on placement. Once you have finished the time frame requested, you will return monitor in box provided.      Follow-Up: At [facility], you and your health needs are our priority.  As part of our continuing mission to provide you with exceptional heart care, we have created designated Provider Care Teams.  These Care Teams include your primary Cardiologist (physician) and Advanced Practice Providers (APPs -  Physician Assistants and Nurse Practitioners) who all work together to provide you with the care you need, when you need it.  We recommend signing up for the patient portal called "MyChart".  Sign up information is provided on this After Visit Summary.  MyChart is used to connect with patients for Virtual Visits (Telemedicine).  Patients  are able to view lab/test results, encounter notes, upcoming appointments, etc.  Non-urgent messages can be sent to your provider as well.   To learn more about what you can do with MyChart, go to [email or web address].    Your next appointment:   4-6 week(s)  The format for your next appointment:   In Person  Provider:   You may see Glenetta Hew, MD or one of the following Advanced Practice Providers on your designated Care Team:   Murray Hodgkins, NP Christell Faith, PA-C Cadence Kathlen Mody, PA-C:1}    Other Instructions N/A

## 2022-01-20 NOTE — Progress Notes (Signed)
Primary Care Provider: Ranae Plumber, [place] Cardiologist: None Electrophysiologist: None  Clinic Note: Chief Complaint  Patient presents with   New Patient (Initial Visit)    Ref by Ranae Plumber, PA for tachycardia and shortness of breath. Medications reviewed by the patient verbally.    Shortness of Breath   ===================================  ASSESSMENT/PLAN   Problem List Items Addressed This Visit       Cardiology Problems   Moderate mitral regurgitation (Chronic)    Not very audible on exam.  We will check an echocardiogram just to confirm that it is still present.      Relevant Orders   ECHOCARDIOGRAM COMPLETE   Paroxysmal tachycardia (HCC) (Chronic)    Sounds like she has tachycardia with exertion which is probably related to deconditioning and COPD related likely hypoxia.  Plan: We will check Zio patch monitor, but would also like to assess with a CPX (Cardiopulmonary Stress Test) depending on echo results.      Relevant Orders   EKG 12-Lead   LONG TERM MONITOR (3-14 DAYS)   Pulmonary hypertension, unspecified (HCC) (Chronic)    Probably related to smoking/COPD.  Recheck 2D echocardiogram.  Depending on data, may want to consider right heart cath for clarification.        Other   Pleuritic chest pain (Chronic)    Associated with COPD exacerbation. Does not seem to be present at this point in time.      DOE (dyspnea on exertion) - Primary (Chronic)    Personal dyspnea does seem to be a pretty longstanding issue for her.  She says this got little worse since her infection which does not really seem all that unusual.  Since she does have an echo showing possible pulmonary hypertension, not unreasonable to recheck an echocardiogram to determine extent of disease now.  We will hold off on further testing until we see the results of the echocardiogram and monitor.  Would probably consider CPX or Coronary CTA      Relevant Orders    ECHOCARDIOGRAM COMPLETE   Emphysema with chronic bronchitis (HCC) (Chronic)    Noted on CT scan.  Longstanding smoking history.  Probably the main reason for her dyspnea.  Also probably etiology for echocardiogram findings of pulmonary hypertension.  In order to determine etiology for dyspnea precordial pain, will evaluate with a 2D echocardiogram to begin with.  Anticipate recommending Cardiopulmonary Exercise Stress Test CPX depending on echo results.      Relevant Medications   albuterol (VENTOLIN HFA) 108 (90 Base) MCG/ACT inhaler   albuterol (ACCUNEB) 1.25 MG/3ML nebulizer solution   fluticasone-salmeterol (ADVAIR) 250-50 MCG/ACT AEPB   montelukast (SINGULAIR) 10 MG tablet   SPIRIVA HANDIHALER 18 MCG inhalation capsule    ===================================  HPI:    Brianna Gibson is a 66 y.o. female 20+ pack year smoker with Centrilobular Emphysema who is being seen today for the evaluation of TACHYCARDIA, DOE at the request of Bunnie Pion, FNP.([facility])  Last cardiology visit at [facility] 07/23/2018-follow-up from echo and stress test performed to evaluate dyspnea.  Also some right lower chest pain, not exacerbated by physical or emotional stress. Nonischemic GXT with relatively normal echo with the exception of moderate Pulmonary Hypertension, moderate MR/TR with LA dilation and IAS. => Recommended low-dose aspirin.  Smoking cessation counseling.  Chose not to use statin due to intolerance.  Brianna Gibson has not followed up with cardiology since 2019.  Recent Hospitalizations: 12/29/2021 -> Seen at the  [facility] for presumed COPD flare.  Treated with Z-pack & steroids.    Reviewed  CV studies:    The following studies were reviewed today: (if available, images/films reviewed: From Epic Chart or Care Everywhere) Screening CT of the chest 01/09/2022: No coronary artery calcification noted; diffuse moderate centrilobular emphysema.  Several nodules  noted Polysomnogram 10/18/2019: Slept 6.2 hours.  Sleep latency 11 minutes.  Efficiency 88.2%.  Mild sleep disordered breathing (AHI-12.  4% hypopnea.  Overall moderate respiratory disturbance.  RDI.  Considered nasal CPAP-AutoPap 5-20 cm water.  Could also consider mandibular advancement splint or referral to ENT surgery.  Avoid sleeping supine Echocardiogram 07/18/2018: : [facility] Normal LV size/function with mild LVH.  EF 55-60 %.  No RWMA.  Severe LA enlargement with IAS.  No suggestion of PFO/ASD normal RV.  Moderate MR and TR.  Trivial AI.  Moderate Pulmonary Hypertension GXT July 2019: Adequate exercise tolerance nondiagnostic ST depressions in inferior leads.  Interval History:   Brianna Gibson presents here today still having some dyspnea.  She says she feels her heart racing more so than usual when she is active.  Now that her upper respiratory tract infection seems to be clearing up this is improving a little bit.  But still noticing it.  She says that she feels her heart rate increasing with a flutter sensation with associated with shortness of breath and dizziness.  No syncope or near syncope.  She describes as being regular.  He just takes a long time to go back to normal after activity.  She says that she is not very active, partly because of underlying exertional dyspnea.  But she says these episodes where the heart rate going fast can happen up to 3 times a week.  They just got worse with her COPD flare. She really denies any PND orthopnea.  She sleeps in a recliner just because it is easier to get up out of.  She says that at rest, her heart rate is not fast, just goes up very fast with exertion.  She has not had any syncope or near syncope,.  She is also not had any chest discomfort since taking her antibiotics for pneumonia/bronchitis flare.  CV Review of Symptoms (Summary) Cardiovascular ROS: positive for - dyspnea on exertion, palpitations, rapid heart rate, and  shortness of breath negative for - chest pain, edema, irregular heartbeat, orthopnea, paroxysmal nocturnal dyspnea, or syncope or near syncope, TIA/amaurosis fugax or claudication  PAD Screen 01/20/2022  Previous PAD dx? No  Previous surgical procedure? No  Pain with walking? Yes  Subsides with rest? No  Feet/toe relief with dangling? No  Painful, non-healing ulcers? No  Extremities discolored? No    REVIEWED OF SYSTEMS   Review of Systems  Constitutional:  Positive for malaise/fatigue. Negative for weight loss.  HENT:  Negative for congestion and nosebleeds.   Respiratory:  Positive for cough (Better since the [facility] exacerbation improved, but has chronic cough.), shortness of breath and wheezing. Negative for sputum production (No longer since her flareup was treated.).   Cardiovascular:  Negative for leg swelling.       Per HPI  Gastrointestinal:  Positive for abdominal pain (From coughing.  Is mostly in epigastric diffuse discomfort). Negative for blood in stool and melena.  Genitourinary:  Negative for hematuria.  Musculoskeletal:  Positive for joint pain. Negative for back pain.  Neurological:  Positive for dizziness and weakness (Generalized). Negative for focal weakness and headaches.  Psychiatric/Behavioral:  Negative  for depression and memory loss. The patient is nervous/anxious (Has OCD).   All other systems reviewed and are negative.  I have reviewed and (if needed) personally updated the patient's problem list, medications, allergies, past medical and surgical history, social and family history.   PAST MEDICAL HISTORY   Past Medical History:  Diagnosis Date   COPD, mild (HCC)    Mild concentric left ventricular hypertrophy    Mitral regurgitation    Moderate pulmonary hypertension (HCC)    Tobacco abuse     PAST SURGICAL HISTORY   Past Surgical History:  Procedure Laterality Date   GXT/GRADUATED EXERCISE TREADMILL STRESS TEST  06/2018   Adequate exercise  tolerance nondiagnostic ST depressions in inferior leads.   TRANSTHORACIC ECHOCARDIOGRAM  07/18/2018   [facility] Normal LV size/function with mild LVH.  EF 55-60 %.  No RWMA.  Severe LA enlargement with IAS.  No suggestion of PFO/ASD normal RV.  Moderate MR and TR.  Trivial AI.  Moderate Pulmonary Hypertension     There is no immunization history on file for this patient.  MEDICATIONS/ALLERGIES   Current Meds  Medication Sig   albuterol (ACCUNEB) 1.25 MG/3ML nebulizer solution Inhale into the lungs.   albuterol (VENTOLIN HFA) 108 (90 Base) MCG/ACT inhaler Inhale into the lungs.   escitalopram (LEXAPRO) 10 MG tablet Take 10 mg by mouth daily.   fluticasone-salmeterol (ADVAIR) 250-50 MCG/ACT AEPB Inhale into the lungs.   ibuprofen (ADVIL) 200 MG tablet Take 200 mg by mouth every 6 (six) hours as needed.   montelukast (SINGULAIR) 10 MG tablet Take 10 mg by mouth at bedtime.   SPIRIVA HANDIHALER 18 MCG inhalation capsule 1 capsule daily.   zolpidem (AMBIEN) 10 MG tablet Take 5-10 mg by mouth at bedtime as needed.    Allergies  Allergen Reactions   Statins Other (See Comments)    Myalgias    SOCIAL HISTORY/FAMILY HISTORY   Reviewed in Epic:   Social History   Tobacco Use   Smoking status: Every Day    Packs/day: 0.50    Years: 45.00    Pack years: 22.50    Types: Cigarettes   Tobacco comments:    Smokes 3-4 cigarettes daily   Vaping Use   Vaping Use: Never used  Substance Use Topics   Alcohol use: Yes    Comment: socially   Social History   Social History Narrative   Not on file   Family History  Problem Relation Age of Onset   Heart disease Mother    Hypertension Mother    Breast cancer Mother    Breast cancer Sister    Breast cancer Maternal Aunt    Rectal cancer Maternal Grandmother   Family History  Problem Relation Age of Onset   High blood pressure (Hypertension) Mother   Diabetes type II Mother   Cancer Mother  LUNG   High blood  pressure (Hypertension) Sister   Obesity Sister   No Known Problems Daughter   No Known Problems Son   Rectal cancer Maternal Grandmother   No Known Problems Maternal Grandfather   High blood pressure (Hypertension) Sister   Obesity Sister   High blood pressure (Hypertension) Sister   Obesity Sister   Diabetes type II Sister   No Known Problems Son   OBJCTIVE -PE, EKG, labs   Wt Readings from Last 3 Encounters:  01/20/22 176 lb (79.8 kg)  04/16/16 167 lb 9.6 oz (76 kg)    Physical Exam: BP 124/78 (BP Location:  Right Arm, Patient Position: Sitting, Cuff Size: Normal)    Pulse 88    Ht '5\' 7"'  (1.702 m)    Wt 176 lb (79.8 kg)    SpO2 98%    BMI 27.57 kg/m  Physical Exam Vitals reviewed.  Constitutional:      General: She is not in acute distress.    Appearance: Normal appearance. She is normal weight. She is ill-appearing (Mild chronic ill appearance). She is not toxic-appearing.  HENT:     Head: Normocephalic and atraumatic.  Neck:     Vascular: No carotid bruit or JVD.  Cardiovascular:     Rate and Rhythm: Normal rate and regular rhythm. No extrasystoles are present.    Chest Wall: PMI is not displaced.     Pulses: Decreased pulses (Diminished but palpable pedal pulses.).     Heart sounds: S1 normal and S2 normal. Heart sounds are distant. No murmur (Cannot exclude soft HSM at apex, but borderline) heard.   No friction rub. No gallop.  Musculoskeletal:     Cervical back: Normal range of motion and neck supple.  Neurological:     Mental Status: She is alert.     Adult ECG Report  Rate: 88 ;  Rhythm: normal sinus rhythm and left atrial enlargement.  Cannot exclude anterior MI, age-indeterminate.  Borderline EKG ;   Narrative Interpretation: Borderline  Recent Labs: Reviewed.  No recent labs [place] Related to Lipid Panel w/calc LDL Component 06/28/19 05/18/18 05/08/17 06/16/11  Cholesterol, Total 178 159 176 173   Triglyceride 65 58 100 61   HDL  (High Density Lipoprotein) Cholesterol 75.0 69.4 74.8 --  LDL Calculated 90     Comprehensive Metabolic Panel (CMP) Component 06/28/19   Glucose 93  Sodium 140  Potassium 4.1  Chloride 107  Carbon Dioxide (CO2) 27.0  Urea Nitrogen (BUN) 16  Creatinine 0.9  Glomerular Filtration Rate (eGFR), MDRD Estimate 77  Calcium 9.7  AST  23  ALT  17  Alk Phos (alkaline Phosphatase) 109 High   Albumin 4.5  Hgb A1C 5.7   CBC w/auto Differential (3 Part) Component 06/28/19   WBC (White Blood Cell Count) 8.8  RBC (Red Blood Cell Count) 4.50  Hemoglobin 13.3  Hematocrit 40.3  Platelet Count 195   ==================================================  COVID-19 Education: The signs and symptoms of COVID-19 were discussed with the patient and how to seek care for testing (follow up with PCP or arrange E-visit).    I spent a total of 32 minutes with the patient spent in direct patient consultation.  Additional time spent with chart review  / charting (studies, outside notes, etc): 21 min Total Time: 53 min  Current medicines are reviewed at length with the patient today.  (+/- concerns) none  This visit occurred during the SARS-CoV-2 public health emergency.  Safety protocols were in place, including screening questions prior to the visit, additional usage of staff PPE, and extensive cleaning of exam room while observing appropriate contact time as indicated for disinfecting solutions.  Notice: This dictation was prepared with Dragon dictation along with smart phrase technology. Any transcriptional errors that result from this process are unintentional and may not be corrected upon review.   Studies Ordered:  Orders Placed This Encounter  Procedures   LONG TERM MONITOR (3-14 DAYS)   EKG 12-Lead   ECHOCARDIOGRAM COMPLETE    Patient Instructions / Medication Changes & Studies & Tests Ordered   Patient Instructions  Medication Instructions:   Your  physician recommends that you continue  on your current medications as directed. Please refer to the Current Medication list given to you today.   *If you need a refill on your cardiac medications before your next appointment, please call your pharmacy*   Lab Work:  None ordered  If you have labs (blood work) drawn today and your tests are completely normal, you will receive your results only by: [place] (if you have MyChart) OR A paper copy in the mail If you have any lab test that is abnormal or we need to change your treatment, we will call you to review the results.   Testing/Procedures:  Echocardiogram - Your physician has requested that you have an echocardiogram in 1 month. Echocardiography is a painless test that uses sound waves to create images of your heart. It provides your doctor with information about the size and shape of your heart and how well your hearts chambers and valves are working. This procedure takes approximately one hour. There are no restrictions for this procedure.  2. Your provider has ordered a heart monitor to wear for 14 days. This will be mailed to your home with instructions on placement. Once you have finished the time frame requested, you will return monitor in box provided.     Follow-Up: At [facility], you and your health needs are our priority.  As part of our continuing mission to provide you with exceptional heart care, we have created designated Provider Care Teams.  These Care Teams include your primary Cardiologist (physician) and Advanced Practice Providers (APPs -  Physician Assistants and Nurse Practitioners) who all work together to provide you with the care you need, when you need it.  We recommend signing up for the patient portal called "MyChart".  Sign up information is provided on this After Visit Summary.  MyChart is used to connect with patients for Virtual Visits (Telemedicine).  Patients are able to view lab/test results, encounter notes, upcoming  appointments, etc.  Non-urgent messages can be sent to your provider as well.   To learn more about what you can do with MyChart, go to [email or web address].    Your next appointment:   4-6 week(s)  The format for your next appointment:   In Person  Provider:   You may see Glenetta Hew, MD or one of the following Advanced Practice Providers on your designated Care Team:   Murray Hodgkins, NP Christell Faith, PA-C Cadence Kathlen Mody, PA-C:1}    Other Instructions N/A    Glenetta Hew, M.D., M.S. Interventional Cardiologist   Pager # [phone] Phone # [phone] [address] [place],  [address]   Thank you for choosing [facility] [place]!!

## 2022-01-23 ENCOUNTER — Encounter: Payer: Self-pay | Admitting: Cardiology

## 2022-01-23 NOTE — Assessment & Plan Note (Signed)
Noted on CT scan.  Longstanding smoking history.  Probably the main reason for her dyspnea.  Also probably etiology for echocardiogram findings of pulmonary hypertension.  In order to determine etiology for dyspnea precordial pain, will evaluate with a 2D echocardiogram to begin with.  Anticipate recommending Cardiopulmonary Exercise Stress Test CPX depending on echo results.

## 2022-01-23 NOTE — Assessment & Plan Note (Signed)
Associated with COPD exacerbation. Does not seem to be present at this point in time.

## 2022-01-23 NOTE — Assessment & Plan Note (Signed)
Personal dyspnea does seem to be a pretty longstanding issue for her.  She says this got little worse since her infection which does not really seem all that unusual.  Since she does have an echo showing possible pulmonary hypertension, not unreasonable to recheck an echocardiogram to determine extent of disease now.  We will hold off on further testing until we see the results of the echocardiogram and monitor.  Would probably consider CPX or Coronary CTA

## 2022-01-23 NOTE — Assessment & Plan Note (Signed)
Sounds like she has tachycardia with exertion which is probably related to deconditioning and COPD related likely hypoxia.  Plan: We will check Zio patch monitor, but would also like to assess with a CPX (Cardiopulmonary Stress Test) depending on echo results.

## 2022-01-23 NOTE — Assessment & Plan Note (Signed)
Probably related to smoking/COPD.  Recheck 2D echocardiogram.  Depending on data, may want to consider right heart cath for clarification.

## 2022-01-23 NOTE — Assessment & Plan Note (Signed)
Not very audible on exam.  We will check an echocardiogram just to confirm that it is still present.

## 2022-01-24 DIAGNOSIS — I479 Paroxysmal tachycardia, unspecified: Secondary | ICD-10-CM | POA: Diagnosis not present

## 2022-02-09 ENCOUNTER — Other Ambulatory Visit: Payer: Self-pay

## 2022-02-09 ENCOUNTER — Ambulatory Visit (INDEPENDENT_AMBULATORY_CARE_PROVIDER_SITE_OTHER): Payer: Medicare Other

## 2022-02-09 DIAGNOSIS — R0609 Other forms of dyspnea: Secondary | ICD-10-CM

## 2022-02-09 DIAGNOSIS — I340 Nonrheumatic mitral (valve) insufficiency: Secondary | ICD-10-CM

## 2022-02-09 LAB — ECHOCARDIOGRAM COMPLETE
AR max vel: 2.9 cm2
AV Area VTI: 2.86 cm2
AV Area mean vel: 2.51 cm2
AV Mean grad: 5 mmHg
AV Peak grad: 9.9 mmHg
Ao pk vel: 1.57 m/s
Area-P 1/2: 3.48 cm2
Calc EF: 66.3 %
S' Lateral: 3.2 cm
Single Plane A2C EF: 63.5 %
Single Plane A4C EF: 68.2 %

## 2022-02-10 ENCOUNTER — Telehealth: Payer: Self-pay | Admitting: Emergency Medicine

## 2022-02-10 NOTE — Telephone Encounter (Signed)
-----   Message from Marykay Lex, MD sent at 02/09/2022  5:34 PM EST ----- Echocardiogram results: Normal pulm function with ejection fraction of 60 to 65%.  (Normal range 50 to 70%).  Abnormal relaxation-grade 2 diastolic dysfunction -> this means that the ventricle is somewhat stiff and does not relax fully.  The mitral valve only has mild backwards leaking.  Not significant.  Had previously been read by the outside study is moderate.  This is an improvement.  It means that the original episode was probably during an acute illness of some sort and it is will be described as a "functional "mitral regurgitation".   Bryan Lemma, MD

## 2022-02-10 NOTE — Telephone Encounter (Signed)
Called and spoke with patient. Results reviewed, patient verbalized understanding, questions (if any) answered.   Patient voiced appreciation for the call and understands that they can reach out to our office with any questions or concerns.

## 2022-03-02 ENCOUNTER — Encounter: Payer: Self-pay | Admitting: Cardiology

## 2022-03-02 NOTE — Progress Notes (Signed)
? ?Primary Care Provider: Shane CrutchMarkley, Linda, PA ?Cardiologist: None ?Electrophysiologist: None ? ?Clinic Note: ?Chief Complaint  ?Patient presents with  ? 6 week follow up   ?  Discuss Echo & Zio results. "Doing well." Medications reviewed by the patient verbally.   ? ?=================================== ? ?ASSESSMENT/PLAN  ? ?Problem List Items Addressed This Visit   ? ?  ? Cardiology Problems  ? Moderate mitral regurgitation (Chronic)  ?  Findings on current echocardiogram are more consistent with exam findings.  This would suggest only mild MR indicating that the reading of moderate MR on the last echo. was probably functional. ? ?Simply need to control blood pressure. ? ?  ?  ? Paroxysmal tachycardia (HCC) - Primary (Chronic)  ?  She does have a pretty high heart rate range, but is actually down in the 60s at rest.  Short bursts of atrial runs with the longest being 18 beats. ?Symptoms seem to be pretty stabilized.  She probably has some exercise intolerance with lung disease and the heart rate increases probably compensatory. ? ?  ?  ? Pulmonary hypertension, unspecified (HCC) (Chronic)  ?  Previous echocardiogram was likely in the setting of COPD exacerbation with acute illness./Hypoxia.  Probably exacerbated the pulmonary pressures which seem to be much better on follow-up echocardiogram. ? ?Recommend treating underlying source. ? ?  ?  ?  ? Other  ? Elevated blood pressure reading  ?  Blood pressure pretty high today, but she seems to be pretty comfortable and fine.  We will need to reassess, low threshold to consider initiating therapy but would preferentially use ARB over ACE-I to avoid pulmonary complications. ? ?  ?  ? Pleuritic chest pain (Chronic)  ?  No longer present.  Only noted with significant coughing. ? ?  ?  ? DOE (dyspnea on exertion) (Chronic)  ?  Echo actually seem pretty normal.  We talked about possibly doing a CPX or Coronary CTA, but since she is feeling stable, we will hold off.. ? ?  ?   ? ? ?=================================== ? ?HPI:   ? ?Brianna Gibson is a 66 y.o. female with a PMH notable for 20+ Pack Year Smoking History-Centrilobular Emphysema, reported pulmonary hypertension (moderate MR/TR), likely OSA, HLD (statin intolerant) who presents today for 6-8-week follow-up to discuss test results.   ? ?Brianna Gibson was seen in initial consultation on January 20, 2022 at the request of Lorenda Cahillnna Crawford, FNP [facility](Clinic).  (She had [facility]-but not seen since 2019). => Major complaints were continued dyspnea, and heart rate racing (usually worse with activity).  Symptoms were improving with improvement of her COPD flare/URI.  ?14-day Zio patch and echo ordered ? ?Recent Hospitalizations: none ? ?Reviewed  CV studies:   ? ?The following studies were reviewed today: (if available, images/films reviewed: From Epic Chart or Care Everywhere) ?TTE 02/09/2022: EF 60 to 65%.  No RWMA.  GR 2 DD.-Mild LA dilation.   Mild MR and mild TR. Normal RV size and function.  Normal estimated RVP/PAP, normal RAP ?14-day Zio patch monitor:Patch Wear Time: 10 days and 4 hours (2023-02-06T09:39:11-499 to 2023-02-16T14:29:21-0500) ?Predominant rhythm is Sinus Rhythm: Heart rate range 60-134 bpm. Average 86 bpm. ?Rare isolated PACs and PVCs with rare couplets and triplets. ?1 ventricular run-6 beats at a rate of 160 bpm. ?20 atrial runs: Fastest 4 beats at a rate of 190 bpm, longest 18 beats with an average rate 122 bpm. ?No sustained tachycardia or bradycardia. ?No  sustained arrhythmias: Atrial tachycardia, supraventricular tachycardia, atrial fibrillation, atrial flutter, ventricular tachycardia. ?  ?Overall relatively normal study.  Short atrial and ventricular bursts.  No symptoms noted. ? ?Interval History:  ? ?Brianna Gibson returns for follow-up stating that she actually is feeling little bit better.  Breathing is improving since she was on treatment for COPD exacerbation.   Interestingly, current echocardiogram does not really show any significant evidence of elevated pulmonary pressures with significant MR or TR.  This was suggested the findings were situational in the setting of COPD exacerbation. ? ?Currently she seems to be feeling with the allergy symptoms that are making her somewhat short of breath. ? ?She denies any PND orthopnea.  No significant palpitation episodes.  That seems to be pretty stable without any agents. ?Same baseline dyspnea.  No worse than normal. ? ?CV Review of Symptoms (Summary) ?Cardiovascular ROS: positive for - dyspnea on exertion, palpitations, shortness of breath, and all relatively stable not improved. ?negative for - chest pain, irregular heartbeat, orthopnea, palpitations, rapid heart rate, or lightheadedness or dizziness, syncope/near syncope or TIA/amaurosis fugax, claudication ? ?REVIEWED OF SYSTEMS  ? ?Review of Systems  ?Constitutional:  Positive for malaise/fatigue (At baseline, but seems to be bothered right now by allergies.). Negative for weight loss.  ?HENT:  Positive for congestion and sinus pain. Negative for nosebleeds and sore throat.   ?Respiratory:  Positive for cough (Clearing her throat, but not like she had with COPD exacerbation.), sputum production (Sputum from congestion), shortness of breath (No worse than baseline) and wheezing. Negative for hemoptysis.   ?Cardiovascular:   ?     Per HPI  ?Gastrointestinal:  Negative for abdominal pain, blood in stool and melena.  ?Genitourinary:  Negative for hematuria.  ?Musculoskeletal:  Positive for joint pain. Negative for myalgias.  ?Neurological:  Positive for dizziness (A little bit of vertigo due to congestion.). Negative for focal weakness.  ?Psychiatric/Behavioral:  Negative for memory loss. The patient is nervous/anxious (OCD). The patient does not have insomnia.   ? ? ?I have reviewed and (if needed) personally updated the patient's problem list, medications, allergies, past  medical and surgical history, social and family history.  ? ?PAST MEDICAL HISTORY  ? ?Past Medical History:  ?Diagnosis Date  ? COPD, mild (HCC)   ? Mild concentric left ventricular hypertrophy   ? Mitral regurgitation   ? Moderate pulmonary hypertension (HCC)   ? Tobacco abuse   ? ? ?PAST SURGICAL HISTORY  ? ?Past Surgical History:  ?Procedure Laterality Date  ? GXT/GRADUATED EXERCISE TREADMILL STRESS TEST  06/2018  ? Adequate exercise tolerance nondiagnostic ST depressions in inferior leads.  ? TRANSTHORACIC ECHOCARDIOGRAM  07/18/2018  ? [facility] Normal LV size/function with mild LVH.  EF 55-60 %.  No RWMA.  Severe LA enlargement with IAS.  No suggestion of PFO/ASD normal RV.  Moderate MR and TR.  Trivial AI.  Moderate Pulmonary Hypertension  ? TRANSTHORACIC ECHOCARDIOGRAM  02/09/2022  ? [facility]: EF 60 to 65%.  No RWMA.  GR 2 DD.-Mild LA dilation.   Mild MR and mild TR. Normal RV size and function.  Normal estimated RVP/PAP, normal RAP  ? ?Screening CT of the chest 01/09/2022: No coronary artery calcification noted; diffuse moderate centrilobular emphysema.  Several nodules noted ?Polysomnogram 10/18/2019: Slept 6.2 hours.  Sleep latency 11 minutes.  Efficiency 88.2%.  Mild sleep disordered breathing (AHI-12.  4% hypopnea.  Overall moderate respiratory disturbance.  RDI.  Considered nasal CPAP-AutoPap 5-20 cm water.  Could  also consider mandibular advancement splint or referral to ENT surgery.  Avoid sleeping supine ? ?There is no immunization history on file for this patient. ? ?MEDICATIONS/ALLERGIES  ? ?Current Meds  ?Medication Sig  ? albuterol (ACCUNEB) 1.25 MG/3ML nebulizer solution Inhale into the lungs.  ? albuterol (VENTOLIN HFA) 108 (90 Base) MCG/ACT inhaler Inhale into the lungs.  ? escitalopram (LEXAPRO) 10 MG tablet Take 10 mg by mouth daily.  ? fluticasone-salmeterol (ADVAIR) 250-50 MCG/ACT AEPB Inhale into the lungs.  ? ibuprofen (ADVIL) 200 MG tablet Take 200 mg by mouth every 6  (six) hours as needed.  ? montelukast (SINGULAIR) 10 MG tablet Take 10 mg by mouth at bedtime.  ? SPIRIVA HANDIHALER 18 MCG inhalation capsule 1 capsule daily.  ? zolpidem (AMBIEN) 10 MG tablet Take 5-10 mg

## 2022-03-03 ENCOUNTER — Other Ambulatory Visit: Payer: Self-pay

## 2022-03-03 ENCOUNTER — Encounter: Payer: Self-pay | Admitting: Cardiology

## 2022-03-03 ENCOUNTER — Ambulatory Visit (INDEPENDENT_AMBULATORY_CARE_PROVIDER_SITE_OTHER): Payer: Medicare Other | Admitting: Cardiology

## 2022-03-03 VITALS — BP 158/90 | HR 78 | Ht 67.0 in | Wt 176.1 lb

## 2022-03-03 DIAGNOSIS — I340 Nonrheumatic mitral (valve) insufficiency: Secondary | ICD-10-CM | POA: Diagnosis not present

## 2022-03-03 DIAGNOSIS — I272 Pulmonary hypertension, unspecified: Secondary | ICD-10-CM | POA: Diagnosis not present

## 2022-03-03 DIAGNOSIS — I479 Paroxysmal tachycardia, unspecified: Secondary | ICD-10-CM

## 2022-03-03 DIAGNOSIS — R0781 Pleurodynia: Secondary | ICD-10-CM

## 2022-03-03 DIAGNOSIS — R030 Elevated blood-pressure reading, without diagnosis of hypertension: Secondary | ICD-10-CM

## 2022-03-03 DIAGNOSIS — R0609 Other forms of dyspnea: Secondary | ICD-10-CM

## 2022-03-03 NOTE — Patient Instructions (Signed)
Medication Instructions:  ? ?Your physician recommends that you continue on your current medications as directed. Please refer to the Current Medication list given to you today.  ? ?*If you need a refill on your cardiac medications before your next appointment, please call your pharmacy* ? ? ?Lab Work: ?None ordered ? ?If you have labs (blood work) drawn today and your tests are completely normal, you will receive your results only by: ?MyChart Message (if you have MyChart) OR ?A paper copy in the mail ?If you have any lab test that is abnormal or we need to change your treatment, we will call you to review the results. ? ? ?Testing/Procedures: ?None ordered ? ? ?Follow-Up: ?At [facility], you and your health needs are our priority.  As part of our continuing mission to provide you with exceptional heart care, we have created designated Provider Care Teams.  These Care Teams include your primary Cardiologist (physician) and Advanced Practice Providers (APPs -  Physician Assistants and Nurse Practitioners) who all work together to provide you with the care you need, when you need it. ? ?We recommend signing up for the patient portal called "MyChart".  Sign up information is provided on this After Visit Summary.  MyChart is used to connect with patients for Virtual Visits (Telemedicine).  Patients are able to view lab/test results, encounter notes, upcoming appointments, etc.  Non-urgent messages can be sent to your provider as well.   ?To learn more about what you can do with MyChart, go to [email or web address].   ? ?Your next appointment:   ?7 month(s) ? ?The format for your next appointment:   ?In Person ? ?Provider:   ?You may see Bryan Lemma, MD or one of the following Advanced Practice Providers on your designated Care Team:   ?Nicolasa Ducking, NP ?Eula Listen, PA-C ?Cadence Fransico Michael, PA-C ? ? ?Other Instructions ?N/A ?

## 2022-04-03 ENCOUNTER — Encounter: Payer: Self-pay | Admitting: Cardiology

## 2022-04-03 DIAGNOSIS — R030 Elevated blood-pressure reading, without diagnosis of hypertension: Secondary | ICD-10-CM | POA: Insufficient documentation

## 2022-04-03 NOTE — Assessment & Plan Note (Signed)
Blood pressure pretty high today, but she seems to be pretty comfortable and fine.  We will need to reassess, low threshold to consider initiating therapy but would preferentially use ARB over ACE-I to avoid pulmonary complications. ?

## 2022-04-03 NOTE — Assessment & Plan Note (Addendum)
Findings on current echocardiogram are more consistent with exam findings.  This would suggest only mild MR indicating that the reading of moderate MR on the last echo. was probably functional. ? ?Simply need to control blood pressure. ?

## 2022-04-03 NOTE — Assessment & Plan Note (Signed)
Echo actually seem pretty normal.  We talked about possibly doing a CPX or Coronary CTA, but since she is feeling stable, we will hold off.Marland Kitchen ?

## 2022-04-03 NOTE — Assessment & Plan Note (Signed)
Previous echocardiogram was likely in the setting of COPD exacerbation with acute illness./Hypoxia.  Probably exacerbated the pulmonary pressures which seem to be much better on follow-up echocardiogram. ? ?Recommend treating underlying source. ?

## 2022-04-03 NOTE — Assessment & Plan Note (Signed)
She does have a pretty high heart rate range, but is actually down in the 60s at rest.  Short bursts of atrial runs with the longest being 18 beats. ?Symptoms seem to be pretty stabilized.  She probably has some exercise intolerance with lung disease and the heart rate increases probably compensatory. ?

## 2022-04-03 NOTE — Assessment & Plan Note (Signed)
No longer present.  Only noted with significant coughing. ?

## 2022-09-12 ENCOUNTER — Other Ambulatory Visit: Payer: Self-pay | Admitting: Family Medicine

## 2022-09-12 DIAGNOSIS — Z780 Asymptomatic menopausal state: Secondary | ICD-10-CM

## 2022-09-13 ENCOUNTER — Other Ambulatory Visit: Payer: Self-pay | Admitting: Family Medicine

## 2022-09-13 DIAGNOSIS — Z1231 Encounter for screening mammogram for malignant neoplasm of breast: Secondary | ICD-10-CM

## 2022-10-06 ENCOUNTER — Encounter: Payer: Self-pay | Admitting: Internal Medicine

## 2022-10-06 ENCOUNTER — Ambulatory Visit: Payer: Medicare Other | Admitting: Internal Medicine

## 2022-10-06 DIAGNOSIS — F17210 Nicotine dependence, cigarettes, uncomplicated: Secondary | ICD-10-CM | POA: Diagnosis not present

## 2022-10-06 DIAGNOSIS — J449 Chronic obstructive pulmonary disease, unspecified: Secondary | ICD-10-CM | POA: Diagnosis not present

## 2022-10-06 MED ORDER — PREDNISONE 10 MG PO TABS: ORAL_TABLET | ORAL | 0 refills | Status: DC

## 2022-10-06 MED ORDER — BUDESONIDE-FORMOTEROL FUMARATE 80-4.5 MCG/ACT IN AERO: INHALATION_SPRAY | RESPIRATORY_TRACT | 12 refills | Status: DC

## 2022-10-06 NOTE — Assessment & Plan Note (Signed)
Counseled re importance of smoking cessation but did not meet time criteria for separate billing   Low-dose CT lung cancer screening is recommended for patients who are 20-66 years of age with a 20+ pack-year history of smoking and who are currently smoking or quit <=15 years ago. No coughing up blood  No unintentional weight loss of > 15 pounds in the last 6 months - pt is eligible for scanning yearly until  29 y p quits using present guidelines          Each maintenance medication was reviewed in detail including emphasizing most importantly the difference between maintenance and prns and under what circumstances the prns are to be triggered using an action plan format where appropriate.  Total time for H and P, chart review, counseling, reviewing hfa  device(s) and generating customized AVS unique to this office visit / same day charting = 34 min new pt eval

## 2022-10-06 NOTE — Progress Notes (Signed)
Has had Maderna vaccine x2.

## 2022-10-06 NOTE — Assessment & Plan Note (Signed)
Active smoker - 10/06/2022  After extensive coaching inhaler device,  effectiveness =    50% try symbicort 80 2bid as cough is the chief concern  and max gerd rx short term plus pred x 6 x days   She has more of an AB pattern than copd at this point so d/c spiriva and use symb 80 2bid as maint plus approp saba and only short term gerd rx otc and pred x 6 d  Regroup in 6 weeks

## 2022-10-06 NOTE — Patient Instructions (Addendum)
My office will be contacting you by phone for referral to lung cancer screening program   - if you don't hear back from my office within one week please call us back or notify us thru MyChart and we'll address it right away.   For cough > mucinex dm 1200 mg every 12 hours as needed  Try prilosec otc 20mg   Take 30-60 min before first meal of the day and Pepcid ac (famotidine) 20 mg one @  bedtime until cough is completely gone for at least a week without the need for cough suppression  Prednisone 10 mg take  4 each am x 2 days,   2 each am x 2 days,  1 each am x 2 days and stop   Plan A = Automatic = Always=    stop spiriva and start symbiocort 80 Take 2 puffs first thing in am and then another 2 puffs about 12 hours later.     Work on inhaler technique:  relax and gently blow all the way out then take a nice smooth full deep breath back in, triggering the inhaler at same time you start breathing in.  Hold breath in for at least  5 seconds if you can. Blow out symbicort  thru nose. Rinse and gargle with water when done.  If mouth or throat bother you at all,  try brushing teeth/gums/tongue with arm and hammer toothpaste/ make a slurry and gargle and spit out.      Plan B = Backup (to supplement plan A, not to replace it) Only use your albuterol inhaler as a rescue medication to be used if you can't catch your breath by resting or doing a relaxed purse lip breathing pattern.  - The less you use it, the better it will work when you need it. - Ok to use the inhaler up to 2 puffs  every 4 hours if you must but call for appointment if use goes up over your usual need - Don't leave home without it !!  (think of it like the spare tire for your car)   Plan C = Crisis (instead of Plan B but only if Plan B stops working) - only use your albuterol nebulizer if you first try Plan B and it fails to help > ok to use the nebulizer up to every 4 hours but if start needing it regularly call for immediate  appointment    Please schedule a follow up office visit in 4 weeks, sooner if needed  with all medications /inhalers/ solutions in hand so we can verify exactly what you are taking. This includes all medications from all doctors and over the counters

## 2022-10-06 NOTE — Progress Notes (Signed)
Brianna Gibson, female    DOB: 13-Feb-1956   MRN: [id number]   Brief patient profile:  53   yobf  active smoker from [place]  referred to pulmonary clinic in [facility]  10/06/2022 by Brianna Gibson  for copd eval with onset symptoms around 2017 cough >> sob      History of Present Illness  10/06/2022  Pulmonary/ 1st office eval/ Brianna Gibson / [place]  Office maint on just spiriva Chief Complaint  Patient presents with   Consult  Dyspnea:  not really limited by breathing x steps  MMRC1 = can walk nl pace, flat grade, can't hurry or go uphills or steps s sob   Cough: white / never bloody assoc with nasal congestion / worse with pollen Sleep: in recline 45 degrees "just feels more comfortable"  SABA use: neb seems to help/ so does pred transiently/ spiriva not helping  No obvious day to day or daytime pattern/variability or assoc  mucus plugs or hemoptysis or cp or chest tightness, subjective wheeze or overt sinus or hb symptoms.   Sleeping as above without nocturnal  or early am exacerbation  of respiratory  c/o's or need for noct saba. Also denies any obvious fluctuation of symptoms with weather or environmental changes or other aggravating or alleviating factors except as outlined above   No unusual exposure hx or h/o childhood pna/ asthma or knowledge of premature birth.  Current Allergies, Complete Past Medical History, Past Surgical History, Family History, and Social History were reviewed in Brianna Gibson record.  ROS  The following are not active complaints unless bolded Hoarseness, sore throat, dysphagia, dental problems, itching, sneezing,  nasal congestion or discharge of excess mucus or purulent secretions, ear ache,   fever, chills, sweats, unintended wt loss or wt gain, classically pleuritic or exertional cp,  orthopnea pnd or arm/hand swelling  or leg swelling, presyncope, palpitations, abdominal pain, anorexia, nausea, vomiting, diarrhea  or change in bowel  habits or change in bladder habits, change in stools or change in urine, dysuria, hematuria,  rash, arthralgias, visual complaints, headache, numbness, weakness or ataxia or problems with walking or coordination,  change in mood or  memory.           Past Medical History:  Diagnosis Date   COPD, mild (HCC)    Mild concentric left ventricular hypertrophy    Mitral regurgitation    Moderate pulmonary hypertension (HCC)    Tobacco abuse     Outpatient Medications Prior to Visit  Medication Sig Dispense Refill   albuterol (ACCUNEB) 1.25 MG/3ML nebulizer solution Inhale into the lungs.     albuterol (VENTOLIN HFA) 108 (90 Base) MCG/ACT inhaler Inhale into the lungs.     escitalopram (LEXAPRO) 10 MG tablet Take 10 mg by mouth daily.     ibuprofen (ADVIL) 200 MG tablet Take 200 mg by mouth every 6 (six) hours as needed.     SPIRIVA HANDIHALER 18 MCG inhalation capsule 1 capsule daily.     chlorpheniramine-HYDROcodone (TUSSIONEX) 10-8 MG/5ML SUER Take 5 mLs by mouth every 12 (twelve) hours. (Patient not taking: Reported on 01/20/2022) 140 mL 0   fluticasone-salmeterol (ADVAIR) 250-50 MCG/ACT AEPB Inhale into the lungs. (Patient not taking: Reported on 10/06/2022)     guaiFENesin (MUCINEX) 600 MG 12 hr tablet Take 1 tablet (600 mg total) by mouth 2 (two) times daily. (Patient not taking: Reported on 01/20/2022) 20 tablet 1   guaiFENesin-codeine 100-10 MG/5ML syrup Take 10 mLs by mouth every 4 (  four) hours as needed for cough. (Patient not taking: Reported on 01/20/2022) 120 mL 0   ibuprofen (ADVIL,MOTRIN) 200 MG tablet Take 400 mg by mouth every 6 (six) hours as needed. (Patient not taking: Reported on 01/20/2022)     montelukast (SINGULAIR) 10 MG tablet Take 10 mg by mouth at bedtime. (Patient not taking: Reported on 10/06/2022)     nicotine (NICODERM CQ - DOSED IN MG/24 HOURS) 21 mg/24hr patch Place 1 patch (21 mg total) onto the skin daily. (Patient not taking: Reported on 01/20/2022) 28 patch 0    oxyCODONE (OXY IR/ROXICODONE) 5 MG immediate release tablet Take 1 tablet (5 mg total) by mouth every 4 (four) hours as needed for moderate pain. (Patient not taking: Reported on 01/20/2022) 30 tablet 0   zolpidem (AMBIEN) 10 MG tablet Take 5-10 mg by mouth at bedtime as needed. (Patient not taking: Reported on 10/06/2022)     No facility-administered medications prior to visit.     Objective:     BP 110/78 (BP Location: Left Arm, Patient Position: Sitting, Cuff Size: Large)   Pulse 73   Temp 97.8 F (36.6 C) (Oral)   Ht 5\' 6"  (1.676 m)   Wt 169 lb 3.2 oz (76.7 kg)   SpO2 97%   BMI 27.31 kg/m   SpO2: 97 % RA  Amb pleasant bf, min congested sounding cough    HEENT : Oropharynx  clear  Nasal turbinates nl    NECK :  without  apparent JVD/ palpable Nodes/TM    LUNGS: no acc muscle use,  Min barrel  contour chest wall with bilateral  slightly decreased bs s audible wheeze and  without cough on insp or exp maneuvers and min  Hyperresonant  to  percussion bilaterally    CV:  RRR  no s3 or murmur or increase in P2, and no edema   ABD:  soft and nontender with pos end  insp Hoover's  in the supine position.  No bruits or organomegaly appreciated   MS:  Nl gait/ ext warm without deformities Or obvious joint restrictions  calf tenderness, cyanosis or clubbing     SKIN: warm and dry without lesions    NEURO:  alert, approp, nl sensorium with  no motor or cerebellar deficits apparent.         No recent cxr/ LSCT ordered      Assessment   COPD, mild (HCC) Active smoker - 10/06/2022  After extensive coaching inhaler device,  effectiveness =    50% try symbicort 80 2bid as cough is the chief concern  and max gerd rx short term plus pred x 6 x days   She has more of an AB pattern than copd at this point so d/c spiriva and use symb 80 2bid as maint plus approp saba and only short term gerd rx otc and pred x 6 d  Regroup in 6 weeks   Cigarette smoker Counseled re importance  of smoking cessation but did not meet time criteria for separate billing   Low-dose CT lung cancer screening is recommended for patients who are 18-28 years of age with a 20+ pack-year history of smoking and who are currently smoking or quit <=15 years ago. No coughing up blood  No unintentional weight loss of > 15 pounds in the last 6 months - pt is eligible for scanning yearly until  99 y p quits using present guidelines          Each maintenance medication was  reviewed in detail including emphasizing most importantly the difference between maintenance and prns and under what circumstances the prns are to be triggered using an action plan format where appropriate.  Total time for H and P, chart review, counseling, reviewing hfa  device(s) and generating customized AVS unique to this office visit / same day charting = 34 min new pt eval           Christinia Gully, MD 10/06/2022

## 2022-10-18 ENCOUNTER — Other Ambulatory Visit: Payer: Self-pay | Admitting: *Deleted

## 2022-10-18 DIAGNOSIS — F17210 Nicotine dependence, cigarettes, uncomplicated: Secondary | ICD-10-CM

## 2022-10-18 DIAGNOSIS — Z122 Encounter for screening for malignant neoplasm of respiratory organs: Secondary | ICD-10-CM

## 2022-10-18 DIAGNOSIS — Z87891 Personal history of nicotine dependence: Secondary | ICD-10-CM

## 2022-11-17 ENCOUNTER — Other Ambulatory Visit: Payer: Medicare Other

## 2022-11-23 ENCOUNTER — Ambulatory Visit (INDEPENDENT_AMBULATORY_CARE_PROVIDER_SITE_OTHER): Payer: Medicare Other | Admitting: Acute Care

## 2022-11-23 ENCOUNTER — Encounter: Payer: Self-pay | Admitting: Acute Care

## 2022-11-23 DIAGNOSIS — F17210 Nicotine dependence, cigarettes, uncomplicated: Secondary | ICD-10-CM

## 2022-11-23 NOTE — Progress Notes (Signed)
Virtual Visit via Telephone Note  I connected with Brianna Gibson on 11/23/22 at 10:30 AM EST by telephone and verified that I am speaking with the correct person using two identifiers.  Location: Patient: At home Provider: [address], [place], [place], [address]    I discussed the limitations, risks, security and privacy concerns of performing an evaluation and management service by telephone and the availability of in person appointments. I also discussed with the patient that there may be a patient responsible charge related to this service. The patient expressed understanding and agreed to proceed.   Shared Decision Making Visit Lung Cancer Screening Program [id number])   Eligibility: Age 39 y.o. Pack Years Smoking History Calculation 27 pack year smoking history (# packs/per year x # years smoked) Recent History of coughing up blood  no Unexplained weight loss? no ( >Than 15 pounds within the last 6 months ) Prior History Lung / other cancer no (Diagnosis within the last 5 years already requiring surveillance chest CT Scans). Smoking Status Current Smoker Former Smokers: Years since quit:  NA  Quit Date:  NA  Visit Components: Discussion included one or more decision making aids. yes Discussion included risk/benefits of screening. yes Discussion included potential follow up diagnostic testing for abnormal scans. yes Discussion included meaning and risk of over diagnosis. yes Discussion included meaning and risk of False Positives. yes Discussion included meaning of total radiation exposure. yes  Counseling Included: Importance of adherence to annual lung cancer LDCT screening. yes Impact of comorbidities on ability to participate in the program. yes Ability and willingness to under diagnostic treatment. yes  Smoking Cessation Counseling: Current Smokers:  Discussed importance of smoking cessation. yes Information about tobacco cessation classes and interventions  provided to patient. yes Patient provided with "ticket" for LDCT Scan. yes Symptomatic Patient. no  Counseling NA Diagnosis Code: Tobacco Use Z72.0 Asymptomatic Patient yes  Counseling (Intermediate counseling: > three minutes counseling) [id number] Former Smokers:  Discussed the importance of maintaining cigarette abstinence. yes Diagnosis Code: Personal History of Nicotine Dependence. I33.825 Information about tobacco cessation classes and interventions provided to patient. Yes Patient provided with "ticket" for LDCT Scan. yes Written Order for Lung Cancer Screening with LDCT placed in Epic. Yes (CT Chest Lung Cancer Screening Low Dose W/O CM) [id number] Z12.2-Screening of respiratory organs Z87.891-Personal history of nicotine dependence  I have spent 25 minutes of face to face/ virtual visit   time with  Brianna Gibson discussing the risks and benefits of lung cancer screening. We viewed / discussed a power point together that explained in detail the above noted topics. We paused at intervals to allow for questions to be asked and answered to ensure understanding.We discussed that the single most powerful action that she can take to decrease her risk of developing lung cancer is to quit smoking. We discussed whether or not she is ready to commit to setting a quit date. We discussed options for tools to aid in quitting smoking including nicotine replacement therapy, non-nicotine medications, support groups, Quit Smart classes, and behavior modification. We discussed that often times setting smaller, more achievable goals, such as eliminating 1 cigarette a day for a week and then 2 cigarettes a day for a week can be helpful in slowly decreasing the number of cigarettes smoked. This allows for a sense of accomplishment as well as providing a clinical benefit. I provided  her  with smoking cessation  information  with contact information for community resources, classes, free nicotine  replacement therapy, and  access to mobile apps, text messaging, and on-line smoking cessation help. I have also provided  her  the office contact information in the event she needs to contact me, or the screening staff. We discussed the time and location of the scan, and that either Doroteo Glassman RN, Joella Prince, RN  or I will call / send a letter with the results within 24-72 hours of receiving them. The patient verbalized understanding of all of  the above and had no further questions upon leaving the office. They have my contact information in the event they have any further questions.  I spent 3 minutes counseling on smoking cessation and the health risks of continued tobacco abuse.  I explained to the patient that there has been a high incidence of coronary artery disease noted on these exams. I explained that this is a non-gated exam therefore degree or severity cannot be determined. This patient is not on statin therapy. I have asked the patient to follow-up with their PCP regarding any incidental finding of coronary artery disease and management with diet or medication as their PCP  feels is clinically indicated. The patient verbalized understanding of the above and had no further questions upon completion of the visit.      [name], NP 11/23/2022

## 2022-11-23 NOTE — Patient Instructions (Signed)
Thank you for participating in the [facility]. It was our pleasure to meet you today. We will call you with the results of your scan within the next few days. Your scan will be assigned a Lung RADS category score by the physicians reading the scans.  This Lung RADS score determines follow up scanning.  See below for description of categories, and follow up screening recommendations. We will be in touch to schedule your follow up screening annually or based on recommendations of our providers. We will fax a copy of your scan results to your Primary Care Physician, or the physician who referred you to the program, to ensure they have the results. Please call the office if you have any questions or concerns regarding your scanning experience or results.  Our office number is [phone]. Please speak with Denise Phelps, RN. , or  Denise Buckner RN, They are  our Lung Cancer Screening RN.'s If They are unavailable when you call, Please leave a message on the voice mail. We will return your call at our earliest convenience.This voice mail is monitored several times a day.  Remember, if your scan is normal, we will scan you annually as long as you continue to meet the criteria for the program. (Age 55-77, Current smoker or smoker who has quit within the last 15 years). If you are a smoker, remember, quitting is the single most powerful action that you can take to decrease your risk of lung cancer and other pulmonary, breathing related problems. We know quitting is hard, and we are here to help.  Please let us know if there is anything we can do to help you meet your goal of quitting. If you are a former smoker, congratulations. We are proud of you! Remain smoke free! Remember you can refer friends or family members through the number above.  We will screen them to make sure they meet criteria for the program. Thank you for helping us take better care of you by  participating in Lung Screening.  You can receive free nicotine replacement therapy ( patches, gum or mints) by calling 1-800-QUIT NOW. Please call so we can get you on the path to becoming  a non-smoker. I know it is hard, but you can do this!  Lung RADS Categories:  Lung RADS 1: no nodules or definitely non-concerning nodules.  Recommendation is for a repeat annual scan in 12 months.  Lung RADS 2:  nodules that are non-concerning in appearance and behavior with a very low likelihood of becoming an active cancer. Recommendation is for a repeat annual scan in 12 months.  Lung RADS 3: nodules that are probably non-concerning , includes nodules with a low likelihood of becoming an active cancer.  Recommendation is for a 6-month repeat screening scan. Often noted after an upper respiratory illness. We will be in touch to make sure you have no questions, and to schedule your 6-month scan.  Lung RADS 4 A: nodules with concerning findings, recommendation is most often for a follow up scan in 3 months or additional testing based on our provider's assessment of the scan. We will be in touch to make sure you have no questions and to schedule the recommended 3 month follow up scan.  Lung RADS 4 B:  indicates findings that are concerning. We will be in touch with you to schedule additional diagnostic testing based on our provider's  assessment of the scan.  Other options for assistance in smoking cessation (   As covered by your insurance benefits)  Hypnosis for smoking cessation  [facility] [phone]  Acupuncture for smoking cessation  [facility] [phone]   

## 2022-11-25 ENCOUNTER — Ambulatory Visit
Admission: RE | Admit: 2022-11-25 | Discharge: 2022-11-25 | Disposition: A | Discharge status: Home / Self Care | Payer: Medicare Other | Source: Ambulatory Visit | Attending: Acute Care | Admitting: Acute Care

## 2022-11-25 DIAGNOSIS — Z122 Encounter for screening for malignant neoplasm of respiratory organs: Secondary | ICD-10-CM | POA: Insufficient documentation

## 2022-11-25 DIAGNOSIS — F17210 Nicotine dependence, cigarettes, uncomplicated: Secondary | ICD-10-CM | POA: Insufficient documentation

## 2022-11-25 DIAGNOSIS — Z87891 Personal history of nicotine dependence: Secondary | ICD-10-CM | POA: Insufficient documentation

## 2022-11-28 ENCOUNTER — Other Ambulatory Visit: Payer: Self-pay | Admitting: Acute Care

## 2022-11-28 DIAGNOSIS — F17210 Nicotine dependence, cigarettes, uncomplicated: Secondary | ICD-10-CM

## 2022-11-28 DIAGNOSIS — Z122 Encounter for screening for malignant neoplasm of respiratory organs: Secondary | ICD-10-CM

## 2022-11-28 DIAGNOSIS — Z87891 Personal history of nicotine dependence: Secondary | ICD-10-CM

## 2022-12-06 ENCOUNTER — Ambulatory Visit: Payer: Medicare Other | Admitting: Adult Health

## 2022-12-06 ENCOUNTER — Encounter: Payer: Self-pay | Admitting: Adult Health

## 2022-12-06 VITALS — BP 150/100 | HR 79 | Temp 98.0°F | Ht 67.0 in | Wt 167.0 lb

## 2022-12-06 DIAGNOSIS — R030 Elevated blood-pressure reading, without diagnosis of hypertension: Secondary | ICD-10-CM

## 2022-12-06 DIAGNOSIS — F17210 Nicotine dependence, cigarettes, uncomplicated: Secondary | ICD-10-CM | POA: Diagnosis not present

## 2022-12-06 DIAGNOSIS — J449 Chronic obstructive pulmonary disease, unspecified: Secondary | ICD-10-CM | POA: Diagnosis not present

## 2022-12-06 MED ORDER — PREDNISONE 10 MG PO TABS: ORAL_TABLET | ORAL | 0 refills | Status: DC

## 2022-12-06 MED ORDER — DOXYCYCLINE HYCLATE 100 MG PO TABS: 100.0000 mg | ORAL_TABLET | Freq: Two times a day (BID) | ORAL | 0 refills | Status: DC

## 2022-12-06 MED ORDER — SPIRIVA RESPIMAT 2.5 MCG/ACT IN AERS: 2.0000 | INHALATION_SPRAY | Freq: Every day | RESPIRATORY_TRACT | 5 refills | Status: DC

## 2022-12-06 NOTE — Assessment & Plan Note (Signed)
Smoking cessation discussed in detail 

## 2022-12-06 NOTE — Progress Notes (Signed)
Has received 2 [place]erna vaccines.

## 2022-12-06 NOTE — Patient Instructions (Signed)
Doxycyline 100mg  Twice daily  for 1 week, take with food.  Mucinex DM Twice daily  As needed  cough/congestion  Prednisone taper over next week  Restart Symbicort 80 2 puffs Twice daily  , rinse after use  Restart Spiriva 2 puff daily  Albuterol inhaler As needed   Follow up with in 6 weeks with Dr.  or Kimbree Casanas NP with PFT and .As needed   Please contact office for sooner follow up if symptoms do not improve or worsen or seek emergency care '

## 2022-12-06 NOTE — Assessment & Plan Note (Signed)
COPD exacerbation-recent lung cancer screening CT without acute process.  Notable emphysema.  No suspicious nodules noted. Patient will restart Symbicort.  Also would like for her to restart her Spiriva to maximize her triple therapy maintenance regimen Needs PFTs. Encouraged on smoking cessation. Will wait with empiric antibiotics and steroid taper.  Plan  Patient Instructions  Doxycyline 100mg  Twice daily  for 1 week, take with food.  Mucinex DM Twice daily  As needed  cough/congestion  Prednisone taper over next week  Restart Symbicort 80 2 puffs Twice daily  , rinse after use  Restart Spiriva 2 puff daily  Albuterol inhaler As needed   Follow up with in 6 weeks with Dr.  or Tayra Dawe NP with PFT and .As needed   Please contact office for sooner follow up if symptoms do not improve or worsen or seek emergency care '

## 2022-12-06 NOTE — Addendum Note (Signed)
Addended by: Delrae Rend on: 12/06/2022 03:43 PM   Modules accepted: Orders

## 2022-12-06 NOTE — Assessment & Plan Note (Signed)
Blood pressure is elevated today.  Denies associated symptoms patient is continue follow-up with primary care for ongoing management.

## 2022-12-06 NOTE — Progress Notes (Signed)
@Brianna Gibson  ID: , female    DOB: November 10, 1956, 66 y.o.   MRN: [id number]  Chief Complaint  Brianna Gibson presents with   Follow-up   Acute Visit    Referring provider: [id number], Shane Crutch  HPI: 66 year old female active smoker seen for pulmonary consult October 06, 2022 to establish for COPD Medical history significant for diastolic dysfunction  TEST/EVENTS :  LDCT 11/25/22 Emphysema , no suspicious nodules ,Lung RADS 1   12/06/2022 Follow up: COPD  Brianna Gibson presents for 24-month follow-up.  Brianna Gibson was seen last visit for pulmonary consult for COPD.  Brianna Gibson complains over the last 2 weeks she has had increased cough congestion thick mucus.  Brianna Gibson denies any hemoptysis, fever, chest pain, orthopnea or edema.  Brianna Gibson was changed to Symbicort from Spiriva last visit.  Brianna Gibson says she took this for about a month but did not realize she needed to go back for more refills.  She has been out of inhaler for over a month. Brianna Gibson has a heavy smoking history.  Last visit she was treated for COPD exacerbation with prednisone taper.  Says she got better but then 2 weeks ago symptoms started as above. She had her low-dose CT chest November 25, 2022 that showed emphysema but no suspicious nodules. Lung RADS 1.  Works partime at November 27, 2022 , 20 hrs a week.  Lives at home , son lives with her. Drives . Does all house work , shopping and cooking   Allergies  Allergen Reactions   Statins Other (See Comments)    Myalgias    Immunization History  Administered Date(s) Administered   Fluad Quad(high Dose 65+) 09/13/2022   Pneumococcal Polysaccharide-23 06/28/2019    Past Medical History:  Diagnosis Date   COPD, mild (HCC)    Mild concentric left ventricular hypertrophy    Mitral regurgitation    Moderate pulmonary hypertension (HCC)    Tobacco abuse     Tobacco History: Social History   Tobacco Use  Smoking Status Every Day   Packs/day: 0.50   Years: 54.00   Total pack years:  27.00   Types: Cigarettes  Smokeless Tobacco Not on file  Tobacco Comments   Smokes 3 packs per week.  Wants to quit.  12/06/2022 hfb   Ready to quit: Yes Counseling given: Yes Tobacco comments: Smokes 3 packs per week.  Wants to quit.  12/06/2022 hfb   Outpatient Medications Prior to Visit  Medication Sig Dispense Refill   albuterol (ACCUNEB) 1.25 MG/3ML nebulizer solution Inhale into the lungs.     albuterol (VENTOLIN HFA) 108 (90 Base) MCG/ACT inhaler Inhale into the lungs.     budesonide-formoterol (SYMBICORT) 80-4.5 MCG/ACT inhaler Take 2 puffs first thing in am and then another 2 puffs about 12 hours later. 1 each 12   escitalopram (LEXAPRO) 10 MG tablet Take 10 mg by mouth daily.     ibuprofen (ADVIL) 200 MG tablet Take 200 mg by mouth every 6 (six) hours as needed.     predniSONE (DELTASONE) 10 MG tablet Take  4 each am x 2 days,   2 each am x 2 days,  1 each am x 2 days and stop (Brianna Gibson not taking: Reported on 12/06/2022) 14 tablet 0   No facility-administered medications prior to visit.     Review of Systems:   Constitutional:   No  weight loss, night sweats,  Fevers, chills, fatigue, or  lassitude.  HEENT:   No headaches,  Difficulty swallowing,  Tooth/dental problems, or  Sore throat,                No sneezing, itching, ear ache, nasal congestion, post nasal drip,   CV:  No chest pain,  Orthopnea, PND, swelling in lower extremities, anasarca, dizziness, palpitations, syncope.   GI  No heartburn, indigestion, abdominal pain, nausea, vomiting, diarrhea, change in bowel habits, loss of appetite, bloody stools.   Resp: .  No chest wall deformity  Skin: no rash or lesions.  GU: no dysuria, change in color of urine, no urgency or frequency.  No flank pain, no hematuria   MS:  No joint pain or swelling.  No decreased range of motion.  No back pain.    Physical Exam  BP (!) 150/100 (BP Location: Left Arm, Brianna Gibson Position: Sitting, Cuff Size: Large)   Pulse 79    Temp 98 F (36.7 C) (Oral)   Ht 5\' 7"  (1.702 m)   Wt 167 lb (75.8 kg)   SpO2 97%   BMI 26.16 kg/m   GEN: A/Ox3; pleasant , NAD, well nourished    HEENT:  [place]/AT,  EACs-clear, TMs-wnl, NOSE-clear, THROAT-clear, no lesions, no postnasal drip or exudate noted.   NECK:  Supple w/ fair ROM; no JVD; normal carotid impulses w/o bruits; no thyromegaly or nodules palpated; no lymphadenopathy.    RESP  few trace rhonchi., no accessory muscle use, no dullness to percussion, speaks in full sentences   CARD:  RRR, no m/r/g, no peripheral edema, pulses intact, no cyanosis or clubbing.  GI:   Soft & nt; nml bowel sounds; no organomegaly or masses detected.   Musco: Warm bil, no deformities or joint swelling noted.   Neuro: alert, no focal deficits noted.    Skin: Warm, no lesions or rashes    Lab Results:  CBC     BNP No results found for: "BNP"  ProBNP No results found for: "PROBNP"  Imaging: CT CHEST LUNG CA SCREEN LOW DOSE W/O CM  Result Date: 11/27/2022 CLINICAL DATA:  66 year old female with 27 pack-year history of smoking. Lung cancer screening. EXAM: CT CHEST WITHOUT CONTRAST LOW-DOSE FOR LUNG CANCER SCREENING TECHNIQUE: Multidetector CT imaging of the chest was performed following the standard protocol without IV contrast. RADIATION DOSE REDUCTION: This exam was performed according to the departmental dose-optimization program which includes automated exposure control, adjustment of the mA and/or kV according to Brianna Gibson size and/or use of iterative reconstruction technique. COMPARISON:  Chest CTA 04/16/2016 FINDINGS: Cardiovascular: The heart size is normal. No substantial pericardial effusion. Mild atherosclerotic calcification is noted in the wall of the thoracic aorta. Mediastinum/Nodes: No mediastinal lymphadenopathy. No evidence for gross hilar lymphadenopathy although assessment is limited by the lack of intravenous contrast on the current study. The esophagus has normal  imaging features. There is no axillary lymphadenopathy. Lungs/Pleura: Centrilobular and paraseptal emphysema evident. Calcified granuloma noted right upper lobe. No suspicious pulmonary nodule or mass. No focal airspace consolidation. No pleural effusion. Upper Abdomen: Unremarkable. Musculoskeletal: No worrisome lytic or sclerotic osseous abnormality. IMPRESSION: 1. Lung-RADS 1, negative. Continue annual screening with low-dose chest CT without contrast in 12 months. 2. Aortic Atherosclerosis (ICD10-I70.0) and Emphysema (ICD10-J43.9). Electronically Signed   By: 04/18/2016 M.D.   On: 11/27/2022 12:36         No data to display          No results found for: "NITRICOXIDE"      Assessment & Plan:   COPD, mild (HCC) COPD exacerbation-recent lung cancer screening CT without  acute process.  Notable emphysema.  No suspicious nodules noted. Brianna Gibson will restart Symbicort.  Also would like for her to restart her Spiriva to maximize her triple therapy maintenance regimen Needs PFTs. Encouraged on smoking cessation. Will wait with empiric antibiotics and steroid taper.  Plan  Brianna Gibson Instructions  Doxycyline 100mg  Twice daily  for 1 week, take with food.  Mucinex DM Twice daily  As needed  cough/congestion  Prednisone taper over next week  Restart Symbicort 80 2 puffs Twice daily  , rinse after use  Restart Spiriva 2 puff daily  Albuterol inhaler As needed   Follow up with in 6 weeks with Dr.  or Kendelle Schweers NP with PFT and .As needed   Please contact office for sooner follow up if symptoms do not improve or worsen or seek emergency care '     Cigarette smoker Smoking cessation discussed in detail  Elevated blood pressure reading Blood pressure is elevated today.  Denies associated symptoms Brianna Gibson is continue follow-up with primary care for ongoing management.     Sherene Sires, NP 12/06/2022

## 2023-01-12 ENCOUNTER — Ambulatory Visit: Payer: 59 | Attending: Adult Health

## 2023-01-12 DIAGNOSIS — J449 Chronic obstructive pulmonary disease, unspecified: Secondary | ICD-10-CM | POA: Diagnosis not present

## 2023-01-12 DIAGNOSIS — F17210 Nicotine dependence, cigarettes, uncomplicated: Secondary | ICD-10-CM | POA: Diagnosis not present

## 2023-01-12 LAB — PULMONARY FUNCTION TEST ARMC ONLY
DL/VA % pred: 89 %
DL/VA: 3.65 ml/min/mmHg/L
DLCO unc % pred: 61 %
DLCO unc: 13.36 ml/min/mmHg
FEF 25-75 Post: 1.12 L/sec
FEF 25-75 Pre: 0.75 L/sec
FEF2575-%Change-Post: 48 %
FEF2575-%Pred-Post: 49 %
FEF2575-%Pred-Pre: 33 %
FEV1-%Change-Post: 10 %
FEV1-%Pred-Post: 60 %
FEV1-%Pred-Pre: 55 %
FEV1-Post: 1.63 L
FEV1-Pre: 1.48 L
FEV1FVC-%Change-Post: 2 %
FEV1FVC-%Pred-Pre: 83 %
FEV6-%Change-Post: 6 %
FEV6-%Pred-Post: 72 %
FEV6-%Pred-Pre: 67 %
FEV6-Post: 2.43 L
FEV6-Pre: 2.28 L
FEV6FVC-%Change-Post: 0 %
FEV6FVC-%Pred-Post: 101 %
FEV6FVC-%Pred-Pre: 102 %
FVC-%Change-Post: 7 %
FVC-%Pred-Post: 71 %
FVC-%Pred-Pre: 65 %
FVC-Post: 2.49 L
FVC-Pre: 2.31 L
Post FEV1/FVC ratio: 66 %
Post FEV6/FVC ratio: 98 %
Pre FEV1/FVC ratio: 64 %
Pre FEV6/FVC Ratio: 99 %
RV % pred: 119 %
RV: 2.71 L
TLC % pred: 92 %
TLC: 5.11 L

## 2023-01-16 NOTE — Progress Notes (Signed)
ATC x1.  LVM to return call. 

## 2023-01-17 ENCOUNTER — Telehealth: Payer: Self-pay | Admitting: Adult Health

## 2023-01-18 NOTE — Telephone Encounter (Signed)
Melvenia Needles, NP 01/12/2023  9:43 AM EST     PFTs shows COPD changes.  Will review in detail at follow-up visit next month with Dr. Melvyn Novas     Attempted to call pt but unable to reach. Per DPR, it is okay to leave detailed message on machine so left pt a detailed message of results. Nothing further needed.

## 2023-01-24 ENCOUNTER — Ambulatory Visit
Admission: RE | Admit: 2023-01-24 | Discharge: 2023-01-24 | Disposition: A | Discharge status: Home / Self Care | Payer: 59 | Source: Ambulatory Visit | Attending: Family Medicine | Admitting: Family Medicine

## 2023-01-24 DIAGNOSIS — Z1231 Encounter for screening mammogram for malignant neoplasm of breast: Secondary | ICD-10-CM

## 2023-01-24 DIAGNOSIS — Z780 Asymptomatic menopausal state: Secondary | ICD-10-CM | POA: Diagnosis present

## 2023-01-27 ENCOUNTER — Ambulatory Visit: Payer: 59 | Admitting: Internal Medicine

## 2023-01-27 ENCOUNTER — Encounter: Payer: Self-pay | Admitting: Internal Medicine

## 2023-01-27 VITALS — BP 162/98 | HR 74 | Temp 98.0°F | Ht 67.0 in | Wt 168.6 lb

## 2023-01-27 DIAGNOSIS — J449 Chronic obstructive pulmonary disease, unspecified: Secondary | ICD-10-CM | POA: Diagnosis not present

## 2023-01-27 DIAGNOSIS — F17210 Nicotine dependence, cigarettes, uncomplicated: Secondary | ICD-10-CM

## 2023-01-27 NOTE — Assessment & Plan Note (Signed)
Counseled re importance of smoking cessation but did not meet time criteria for separate billing    - already in LDCT / lung cancer screening program and eligible for 14 more years/ advised         Each maintenance medication was reviewed in detail including emphasizing most importantly the difference between maintenance and prns and under what circumstances the prns are to be triggered using an action plan format where appropriate.  Total time for H and P, chart review, counseling, reviewing hfa/smi  device(s) and generating customized AVS unique to this office visit / same day charting = 24 min

## 2023-01-27 NOTE — Progress Notes (Signed)
Brianna Gibson, female    DOB: 11-Jun-1956   MRN: [id number]   Brief patient profile:  89   yobf  active smoker from [place]  referred to pulmonary Gibson in [facility]  10/06/2022 by Brianna Gibson  for copd eval with onset symptoms around 2017 cough >> sob    History of Present Illness  10/06/2022  Pulmonary/ 1st office eval/ Brianna Gibson / [place] on just spiriva Chief Complaint  Patient presents with   Consult  Dyspnea:  not really limited by breathing x steps  MMRC1 = can walk nl pace, flat grade, can't hurry or go uphills or steps s sob   Cough: white / never bloody assoc with nasal congestion / worse with pollen Sleep: in recline 45 degrees "just feels more comfortable"  SABA use: neb seems to help/ so does pred transiently/ spiriva not helping Rec My office will be contacting you by phone for referral to lung cancer screening program   - if you don't hear back from my office within one week please call us back or notify us thru MyChart and we'll address it right away.  For cough > mucinex dm 1200 mg every 12 hours as needed Try prilosec otc 56m  Take 30-60 min before first meal of the day and Pepcid ac (famotidine) 20 mg one @  bedtime until cough is completely gone for at least a week without the need for cough suppression Prednisone 10 mg take  4 each am x 2 days,   2 each am x 2 days,  1 each am x 2 days and stop  Plan A = Automatic = Always=    stop spiriva and start symbiocort 80 Take 2 puffs first thing in am and then another 2 puffs about 12 hours later.  Work on inhaler technique:  Plan B = Backup (to supplement plan A, not to replace it) Only use your albuterol inhaler as a rescue medication  Plan C = Crisis (instead of Plan B but only if Plan B stops working) - only use your albuterol nebulizer if you first try Plan B and it fails to help  Please schedule a follow up office visit in 4 weeks, sooner if needed  with all medications /inhalers/ solutions in hand       01/27/2023  [facility]   maint on symbicort 80/spiriva and pred finished  1st of year / still smoking  Chief Complaint  Patient presents with   Follow-up    Doing a lot better. No SOB, wheezing or cough.     Dyspnea:  not limited doing custodial cough Cough: none  Sleeping: ok SABA use: still using p work instead of pm symbicort 02: none  Covid status:   2 shots/ no infection   No obvious day to day or daytime variability or assoc excess/ purulent sputum or mucus plugs or hemoptysis or cp or chest tightness, subjective wheeze or overt sinus or hb symptoms.   Sleeping  without nocturnal  or early am exacerbation  of respiratory  c/o's or need for noct saba. Also denies any obvious fluctuation of symptoms with weather or environmental changes or other aggravating or alleviating factors except as outlined above   No unusual exposure hx or h/o childhood pna/ asthma or knowledge of premature birth.  Current Allergies, Complete Past Medical History, Past Surgical History, Family History, and Social History were reviewed in [employer] [facility].  ROS  The following are not active complaints unless bolded Hoarseness, sore throat, dysphagia, dental problems, itching, sneezing,  nasal congestion or discharge of excess mucus or purulent secretions, ear ache,   fever, chills, sweats, unintended wt loss or wt gain, classically pleuritic or exertional cp,  orthopnea pnd or arm/hand swelling  or leg swelling, presyncope, palpitations, abdominal pain, anorexia, nausea, vomiting, diarrhea  or change in bowel habits or change in bladder habits, change in stools or change in urine, dysuria, hematuria,  rash, arthralgias, visual complaints, headache, numbness, weakness or ataxia or problems with walking or coordination,  change in mood or  memory.        Current Meds  Medication Sig   albuterol (ACCUNEB) 1.25 MG/3ML nebulizer solution Inhale  into the lungs.   albuterol (VENTOLIN HFA) 108 (90 Base) MCG/ACT inhaler Inhale into the lungs.   budesonide-formoterol (SYMBICORT) 80-4.5 MCG/ACT inhaler Take 2 puffs first thing in am and then another 2 puffs about 12 hours later.   escitalopram (LEXAPRO) 10 MG tablet Take 10 mg by mouth daily.   ibuprofen (ADVIL) 200 MG tablet Take 200 mg by mouth every 6 (six) hours as needed.   Tiotropium Bromide Monohydrate (SPIRIVA RESPIMAT) 2.5 MCG/ACT AERS Inhale 2 puffs into the lungs daily.                    Past Medical History:  Diagnosis Date   COPD, mild (HCC)    Mild concentric left ventricular hypertrophy    Mitral regurgitation    Moderate pulmonary hypertension (HCC)    Tobacco abuse        Objective:       Wt Readings from Last 3 Encounters:  01/27/23 168 lb 9.6 oz (76.5 kg)  12/06/22 167 lb (75.8 kg)  10/06/22 169 lb 3.2 oz (76.7 kg)      Vital signs reviewed  01/27/2023  - Note at rest 02 sats  100% on RA   General appearance:    amb bf nad/  upset about son's behavior "drove up my bp"     HEENT : Oropharynx  clear      NECK :  without  apparent JVD/ palpable Nodes/TM    LUNGS: no acc muscle use,  Min barrel  contour chest wall with bilateral  slightly decreased bs s audible wheeze and  without cough on insp or exp maneuvers and min  Hyperresonant  to  percussion bilaterally    CV:  RRR  no s3 or murmur or increase in P2, and no edema   ABD:  soft and nontender with pos end  insp Hoover's  in the supine position.  No bruits or organomegaly appreciated   MS:  Nl gait/ ext warm without deformities Or obvious joint restrictions  calf tenderness, cyanosis or clubbing     SKIN: warm and dry without lesions    NEURO:  alert, approp, nl sensorium with  no motor or cerebellar deficits apparent.             I personally reviewed images and agree with radiology impression as follows:   Chest LDSCT     11/27/22   1. Lung-RADS 1, negative. Continue annual  screening with low-dose chest CT without contrast in 12 months. 2. Aortic Atherosclerosis (ICD10-I70.0) and Emphysema (ICD10-J43.9).   Assessment

## 2023-01-27 NOTE — Assessment & Plan Note (Signed)
Active smoker - 10/06/2022   try symbicort 80 2bid as cough is the chief concern  and max gerd rx short term plus pred x 6 x days  - 01/27/2023  After extensive coaching inhaler device,  effectiveness =    75% (short Ti) continue symbicort 80/spiriva 2.5    Group D (now reclassified as E) in terms of symptom/risk and laba/lama/ICS  therefore appropriate rx at this point >>>  stay on symb 75 /spiriva for now since cough was such a problem previously but if flares again then use the 160 but in meantime work of hfa technique and approp saba:  Re SABA :  I spent extra time with pt today reviewing appropriate use of albuterol for prn use on exertion with the following points: 1) saba is for relief of sob that does not improve by walking a slower pace or resting but rather if the pt does not improve after trying this first. 2) If the pt is convinced, as many are, that saba helps recover from activity faster then it's easy to tell if this is the case by re-challenging : ie stop, take the inhaler, then p 5 minutes try the exact same activity (intensity of workload) that just caused the symptoms and see if they are substantially diminished or not after saba 3) if there is an activity that reproducibly causes the symptoms, try the saba 15 min before the activity on alternate days   If in fact the saba really does help, then fine to continue to use it prn but advised may need to look closer at the maintenance regimen being used to achieve better control of airways disease with exertion.

## 2023-01-27 NOTE — Patient Instructions (Signed)
Plan A = Automatic = Always=    symbicort 90/spiriva daily   Work on inhaler technique:  relax and gently blow all the way out then take a nice smooth full deep breath back in, triggering the inhaler at same time you start breathing in.  Hold breath in for at least  5 seconds if you can. Blow out symbicort  thru nose. Rinse and gargle with water when done.  If mouth or throat bother you at all,  try brushing teeth/gums/tongue with arm and hammer toothpaste/ make a slurry and gargle and spit out.       Plan B = Backup (to supplement plan A, not to replace it) Only use your albuterol inhaler as a rescue medication to be used if you can't catch your breath by resting or doing a relaxed purse lip breathing pattern.  - The less you use it, the better it will work when you need it. - Ok to use the inhaler up to 2 puffs  every 4 hours if you must but call for appointment if use goes up over your usual need - Don't leave home without it !!  (think of it like the spare tire for your car)   Plan C = Crisis (instead of Plan B but only if Plan B stops working) - only use your albuterol nebulizer if you first try Plan B and it fails to help > ok to use the nebulizer up to every 4 hours but if start needing it regularly call for immediate appointment   The key is to stop smoking completely before smoking completely stops you - it's not too late!     Please schedule a follow up visit in 3 months but call sooner if needed

## 2023-03-18 ENCOUNTER — Encounter: Payer: Self-pay | Admitting: Intensive Care

## 2023-03-18 ENCOUNTER — Emergency Department
Admission: EM | Admit: 2023-03-18 | Discharge: 2023-03-18 | Disposition: A | Discharge status: Home / Self Care | Payer: Medicare Other | Attending: Emergency Medicine | Admitting: Emergency Medicine

## 2023-03-18 ENCOUNTER — Other Ambulatory Visit: Payer: Self-pay

## 2023-03-18 DIAGNOSIS — R21 Rash and other nonspecific skin eruption: Secondary | ICD-10-CM | POA: Diagnosis present

## 2023-03-18 DIAGNOSIS — L239 Allergic contact dermatitis, unspecified cause: Secondary | ICD-10-CM | POA: Insufficient documentation

## 2023-03-18 MED ORDER — FAMOTIDINE 20 MG PO TABS: 20.0000 mg | ORAL_TABLET | Freq: Every day | ORAL | 0 refills | Status: DC

## 2023-03-18 MED ORDER — CETIRIZINE HCL 10 MG PO TABS: 40.0000 mg | ORAL_TABLET | Freq: Every day | ORAL | 0 refills | Status: DC

## 2023-03-18 MED ORDER — TRIAMCINOLONE ACETONIDE 0.1 % EX CREA: 1.0000 | TOPICAL_CREAM | Freq: Four times a day (QID) | CUTANEOUS | 0 refills | Status: DC

## 2023-03-18 MED ORDER — PREDNISONE 50 MG PO TABS: 50.0000 mg | ORAL_TABLET | Freq: Every day | ORAL | 0 refills | Status: DC

## 2023-03-18 NOTE — ED Triage Notes (Signed)
Patient presents with itchy rash all over upper body. Reports it started yesterday

## 2023-03-18 NOTE — ED Notes (Signed)
Pt denies any change to detergent or soaps.

## 2023-03-18 NOTE — ED Provider Notes (Signed)
[facility] Provider Note  Patient Contact: 4:00 PM (approximate)   History   Rash   HPI  Brianna Gibson is a 67 y.o. female who presents emergency department complaining of a rash to her torso and neck.  Patient states that she has no known allergens.  Denies any new lotions, topicals, make-up, laundry detergent, soaps, shampoos.  Patient states that it is very pruritic.  No other symptoms at this time.  No history of recurring skin infections, recent hot tub use.  No medications for this complaint prior to arrival.     Physical Exam   Triage Vital Signs: ED Triage Vitals  Enc Vitals Group     BP 03/18/23 1522 (!) 170/101     Pulse Rate 03/18/23 1519 85     Resp 03/18/23 1519 16     Temp 03/18/23 1519 97.8 F (36.6 C)     Temp Source 03/18/23 1519 Oral     SpO2 03/18/23 1519 97 %     Weight 03/18/23 1520 165 lb (74.8 kg)     Height 03/18/23 1520 5\' 7"  (1.702 m)     Head Circumference --      Peak Flow --      Pain Score 03/18/23 1520 0     Pain Loc --      Pain Edu? --      Excl. in [place]? --     Most recent vital signs: Vitals:   03/18/23 1519 03/18/23 1522  BP:  (!) 170/101  Pulse: 85   Resp: 16   Temp: 97.8 F (36.6 C)   SpO2: 97%      General: Alert and in no acute distress. ENT:      Ears:       Nose: No congestion/rhinnorhea.      Mouth/Throat: Mucous membranes are moist.  Cardiovascular:  Good peripheral perfusion Respiratory: Normal respiratory effort without tachypnea or retractions. Lungs CTAB. Musculoskeletal: Full range of motion to all extremities.  Neurologic:  No gross focal neurologic deficits are appreciated.  Skin:   No hives, wheals identified.  Patient has a papular rash noted to the torso. Other:   ED Results / Procedures / Treatments   Labs (all labs ordered are listed, but only abnormal results are displayed) Labs Reviewed - No data to display   EKG     RADIOLOGY    No results  found.  PROCEDURES:  Critical Care performed: No  Procedures   MEDICATIONS ORDERED IN ED: Medications - No data to display   IMPRESSION / MDM / [place] / ED COURSE  I reviewed the triage vital signs and the nursing notes.                                 Differential diagnosis includes, but is not limited to, contact dermatitis, allergic reaction, folliculitis, milia rubra,  Patient's presentation is most consistent with acute presentation with potential threat to life or bodily function.   Patient's diagnosis is consistent with allergic dermatitis.  Patient presents emergency department with sudden onset of a papular like rash.  No concern for folliculitis at this time.  Given presentation patient be treated symptomatically with short course of prednisone, antihistamine, topical steroid and famotidine.  Return precautions discussed with the patient.  Follow-up primary care as needed.  Patient is given ED precautions to return to the ED for any worsening or new  symptoms.     FINAL CLINICAL IMPRESSION(S) / ED DIAGNOSES   Final diagnoses:  Allergic dermatitis     Rx / DC Orders   ED Discharge Orders          Ordered    cetirizine (ZYRTEC) 10 MG tablet  Daily        03/18/23 1632    predniSONE (DELTASONE) 50 MG tablet  Daily with breakfast        03/18/23 1632    famotidine (PEPCID) 20 MG tablet  Daily        03/18/23 1632    triamcinolone cream (KENALOG) 0.1 %  4 times daily        03/18/23 1632             Note:  This document was prepared using Dragon voice recognition software and may include unintentional dictation errors.   Brynda Peon 03/18/23 1633    Blake Divine, MD 03/18/23 [id number]

## 2023-07-12 ENCOUNTER — Other Ambulatory Visit: Payer: Self-pay | Admitting: Internal Medicine

## 2023-07-12 ENCOUNTER — Other Ambulatory Visit: Payer: Self-pay

## 2023-07-12 DIAGNOSIS — J449 Chronic obstructive pulmonary disease, unspecified: Secondary | ICD-10-CM

## 2023-07-12 MED ORDER — BUDESONIDE-FORMOTEROL FUMARATE 80-4.5 MCG/ACT IN AERO: 2.0000 | INHALATION_SPRAY | Freq: Two times a day (BID) | RESPIRATORY_TRACT | 1 refills | Status: DC

## 2023-09-11 ENCOUNTER — Encounter: Payer: Self-pay | Admitting: Pulmonary Disease

## 2023-09-11 ENCOUNTER — Ambulatory Visit: Payer: Medicare PPO | Admitting: Pulmonary Disease

## 2023-09-11 ENCOUNTER — Other Ambulatory Visit
Admission: RE | Admit: 2023-09-11 | Discharge: 2023-09-11 | Disposition: A | Discharge status: Home / Self Care | Payer: Medicare PPO | Source: Ambulatory Visit | Attending: Pulmonary Disease | Admitting: Pulmonary Disease

## 2023-09-11 VITALS — BP 132/80 | HR 63 | Temp 97.9°F | Ht 67.0 in | Wt 161.0 lb

## 2023-09-11 DIAGNOSIS — Z720 Tobacco use: Secondary | ICD-10-CM

## 2023-09-11 DIAGNOSIS — J449 Chronic obstructive pulmonary disease, unspecified: Secondary | ICD-10-CM | POA: Diagnosis present

## 2023-09-11 MED ORDER — NICOTINE 7 MG/24HR TD PT24
7.0000 mg | MEDICATED_PATCH | TRANSDERMAL | 0 refills | Status: AC
Start: 2023-09-11 — End: 2023-09-25

## 2023-09-11 MED ORDER — BREZTRI AEROSPHERE 160-9-4.8 MCG/ACT IN AERO
2.0000 | INHALATION_SPRAY | Freq: Two times a day (BID) | RESPIRATORY_TRACT | 3 refills | Status: DC
Start: 2023-09-11 — End: 2024-03-28

## 2023-09-11 MED ORDER — NICOTINE POLACRILEX 2 MG MT LOZG
2.0000 mg | LOZENGE | OROMUCOSAL | 3 refills | Status: AC | PRN
Start: 2023-09-11 — End: 2023-12-10

## 2023-09-11 NOTE — Progress Notes (Signed)
Synopsis: Referred in by Shane Crutch, PA   Subjective:   PATIENT ID: Brianna Gibson GENDER: female DOB: 01-02-1956, MRN: [id number]  Chief Complaint  Patient presents with   Follow-up    Cough, shortness of breath and wheezing only at bedtime.     HPI Brianna Gibson is a 67 year old female patient with a past medical history of tobacco use, COPD previously follows with Dr. Sherene Sires presenting today to the pulmonary clinic to follow-up on her COPD.  She has been doing well overall on Symbicort.  Has some dyspnea on exertion and some cough with sputum production specifically in the morning.  She uses her albuterol 2-3 times a week.  She does have hypersensitivity to dust and chemicals which make her breathing feel worse.  She denies any chest pain any loss of appetite or significant weight loss.  She denies any hemoptysis.  She is up-to-date on her cancer screening.  And enrolled in the low-dose CTs chest last 1 was December 2023 which showed centrilobular and paraseptal emphysema upper lobe predominance bilaterally.  No suspicious nodules or masses observed.  She had a PFT done in January of this year that was consistent with COPD stage II group B.  Family history -strong family history of lung cancer in his mother pancreatic CA and multiple other family members with different cancers.  Social history -active smoker smoked since the age of 18 half a pack per day on average.  Occasional alcohol use.  Denies any illicit drug use.    ROS All systems were reviewed and are negative except for the above.  Objective:   Vitals:   09/11/23 0953 09/11/23 0959  BP: (!) 146/80 132/80  Pulse: 63   Temp: 97.9 F (36.6 C)   TempSrc: Temporal   SpO2: 96%   Weight: 161 lb (73 kg)   Height: 5\' 7"  (1.702 m)    96% on RA BMI Readings from Last 3 Encounters:  09/11/23 25.22 kg/m  03/18/23 25.84 kg/m  01/27/23 26.41 kg/m   Wt Readings from Last 3 Encounters:  09/11/23 161 lb (73 kg)  03/18/23 165  lb (74.8 kg)  01/27/23 168 lb 9.6 oz (76.5 kg)    Physical Exam GEN: NAD, Healthy Appearing HEENT: Supple Neck, Reactive Pupils, EOMI  CVS: Normal S1, Normal S2, RRR, No murmurs or ES appreciated  Lungs: Poor air movement.  Abdomen: Soft, non tender, non distended, + BS  Extremities: Warm and well perfused, No edema  Skin: No suspicious lesions appreciated  Psych: Normal Affect  Ancillary Information   CBC    Component Value Date/Time   WBC 8.3 04/16/2016 0428   RBC 3.64 (L) 04/16/2016 0428   HGB 11.3 (L) 04/16/2016 0428   HGB 13.5 05/06/2013 1952   HCT 32.9 (L) 04/16/2016 0428   HCT 39.9 05/06/2013 1952   PLT 228 04/16/2016 0428   PLT 200 05/06/2013 1952   MCV 90.5 04/16/2016 0428   MCV 88 05/06/2013 1952   MCH 31.0 04/16/2016 0428   MCHC 34.3 04/16/2016 0428   RDW 13.0 04/16/2016 0428   RDW 13.1 05/06/2013 1952   LYMPHSABS 0.7 (L) 04/14/2016 1018   MONOABS 0.7 04/14/2016 1018   EOSABS 0.0 04/14/2016 1018   BASOSABS 0.0 04/14/2016 1018    Imaging  Low Dose CT Chest 01/24 -lung RADS 1 negative for nodules.  Centrilobular and paraseptal emphysema predominantly in the upper lobes.   PFTs 01/25 FEV1 1.48 L 55% of predicted.  FVC 2.31 L 65% of  predicted.  FEV1 to FVC 64.  TLC 92% of predicted.  RV 119% of predicted.  DLCO 61% of predicted.    Latest Ref Rng & Units 01/12/2023    8:45 AM  PFT Results  FVC-Pre L 2.31   FVC-Predicted Pre % 65   FVC-Post L 2.49   FVC-Predicted Post % 71   Pre FEV1/FVC % % 64   Post FEV1/FCV % % 66   FEV1-Pre L 1.48   FEV1-Predicted Pre % 55   FEV1-Post L 1.63   DLCO uncorrected ml/min/mmHg 13.36   DLCO UNC% % 61   DLVA Predicted % 89   TLC L 5.11   TLC % Predicted % 92   RV % Predicted % 119      Assessment & Plan:  Brianna Gibson is a 68 year old female patient with a past medical history of tobacco use, COPD previously follows with Dr. Sherene Sires presenting today to the pulmonary clinic to follow-up on her COPD.  # COPD stage II  group B CAT 13 Given ongoing dyspnea and cough will escalate to triple therapy with Breztri.  []  Start budesonide-formoterol-g 4.8 lycopyrrolate [Breztri] 160-9-4.8 2 puffs twice a day. []  Continue albuterol 2 puffs every 6 hours as needed. []  6-minute walk test  # Lung cancer screening Already enrolled in low-dose CT scan program.  Has repeat CT chest December of this year.  Previous 1 lung RADS 1.  # Tobacco use Counseled regarding tobacco use and tobacco cessation and the fact on COPD progression.  She is agreeable on trial for abstinence.  []  Provide NRT.  Return in about 6 months (around 03/10/2024).  I spent 60 minutes caring for this patient today, including preparing to see the patient, obtaining a medical history , reviewing a separately obtained history, performing a medically appropriate examination and/or evaluation, ordering medications, tests, or procedures, referring and communicating with other health care professionals (not separately reported), documenting clinical information in the electronic health record, and independently interpreting results (not separately reported/billed) and communicating results to the patient/family/caregiver  Janann Colonel, MD [facility] 09/11/2023 2:20 PM

## 2023-09-13 LAB — ALPHA-1-ANTITRYPSIN: A-1 Antitrypsin, Ser: 150 mg/dL (ref 101–187)

## 2023-09-19 ENCOUNTER — Ambulatory Visit (INDEPENDENT_AMBULATORY_CARE_PROVIDER_SITE_OTHER): Payer: Medicare PPO

## 2023-09-19 DIAGNOSIS — J449 Chronic obstructive pulmonary disease, unspecified: Secondary | ICD-10-CM | POA: Diagnosis not present

## 2023-09-19 NOTE — Progress Notes (Signed)
Patient in office for SMW. Patient walked at moderate pace with c/o mild SOB.

## 2023-11-27 ENCOUNTER — Ambulatory Visit
Admission: RE | Admit: 2023-11-27 | Discharge: 2023-11-27 | Disposition: A | Discharge status: Home / Self Care | Payer: Medicare PPO | Source: Ambulatory Visit | Attending: Acute Care | Admitting: Acute Care

## 2023-11-27 DIAGNOSIS — F17210 Nicotine dependence, cigarettes, uncomplicated: Secondary | ICD-10-CM | POA: Diagnosis present

## 2023-11-27 DIAGNOSIS — Z87891 Personal history of nicotine dependence: Secondary | ICD-10-CM | POA: Diagnosis present

## 2023-11-27 DIAGNOSIS — Z122 Encounter for screening for malignant neoplasm of respiratory organs: Secondary | ICD-10-CM | POA: Diagnosis present

## 2023-12-11 ENCOUNTER — Other Ambulatory Visit: Payer: Self-pay

## 2023-12-11 DIAGNOSIS — F17210 Nicotine dependence, cigarettes, uncomplicated: Secondary | ICD-10-CM

## 2023-12-11 DIAGNOSIS — Z87891 Personal history of nicotine dependence: Secondary | ICD-10-CM

## 2023-12-11 DIAGNOSIS — Z122 Encounter for screening for malignant neoplasm of respiratory organs: Secondary | ICD-10-CM

## 2023-12-25 ENCOUNTER — Other Ambulatory Visit: Payer: Self-pay | Admitting: Family Medicine

## 2023-12-25 DIAGNOSIS — Z1231 Encounter for screening mammogram for malignant neoplasm of breast: Secondary | ICD-10-CM

## 2024-01-18 ENCOUNTER — Encounter: Payer: Self-pay | Admitting: *Deleted

## 2024-01-25 ENCOUNTER — Ambulatory Visit: Payer: Medicare PPO | Attending: Cardiology | Admitting: Cardiology

## 2024-01-25 ENCOUNTER — Encounter: Payer: Self-pay | Admitting: Cardiology

## 2024-01-25 VITALS — BP 154/86 | HR 74 | Ht 67.0 in | Wt 156.8 lb

## 2024-01-25 DIAGNOSIS — I479 Paroxysmal tachycardia, unspecified: Secondary | ICD-10-CM

## 2024-01-25 DIAGNOSIS — J449 Chronic obstructive pulmonary disease, unspecified: Secondary | ICD-10-CM

## 2024-01-25 DIAGNOSIS — F17210 Nicotine dependence, cigarettes, uncomplicated: Secondary | ICD-10-CM

## 2024-01-25 DIAGNOSIS — R030 Elevated blood-pressure reading, without diagnosis of hypertension: Secondary | ICD-10-CM

## 2024-01-25 DIAGNOSIS — I340 Nonrheumatic mitral (valve) insufficiency: Secondary | ICD-10-CM

## 2024-01-25 DIAGNOSIS — R0609 Other forms of dyspnea: Secondary | ICD-10-CM | POA: Diagnosis not present

## 2024-01-25 NOTE — Assessment & Plan Note (Signed)
 This seems to be pretty well-controlled and she is not on any medicines for it.  She actually has some rare palpitations but nothing prolonged.  Maintain adequate hydration, avoid excess caffeine

## 2024-01-25 NOTE — Assessment & Plan Note (Signed)
 Tobacco Use Reduced to less than 4 cigarettes a day. Has not smoked since recent oral surgery. -Encourage continued reduction and eventual cessation of smoking. Oral Health Recent oral surgery. On antibiotics and mouthwash. No longer taking oxycodone . -Continue current post-operative care.  Weight Loss Patient has lost about 9 pounds since March of last year. Appetite has decreased. -Encourage healthy eating habits. Monitor weight.

## 2024-01-25 NOTE — Assessment & Plan Note (Signed)
 Echocardiogram actually only suggested mild MR.  Unless pressures increase or symptoms occur, probably would hold off on rechecking echo at this point. Continue to monitor murmur.

## 2024-01-25 NOTE — Patient Instructions (Signed)
 Medication Instructions:  - Your physician recommends that you continue on your current medications as directed. Please refer to the Current Medication list given to you today   Check and record your blood pressure readings 4 times a week over the next couple of months and bring these with you to your next appointment.  *If you need a refill on your cardiac medications before your next appointment, please call your pharmacy*   Lab Work: - none ordered  If you have labs (blood work) drawn today and your tests are completely normal, you will receive your results only by: MyChart Message (if you have MyChart) OR A paper copy in the mail If you have any lab test that is abnormal or we need to change your treatment, we will call you to review the results.   Testing/Procedures: - none ordered   Follow-Up: At [facility], you and your health needs are our priority.  As part of our continuing mission to provide you with exceptional heart care, we have created designated Provider Care Teams.  These Care Teams include your primary Cardiologist (physician) and Advanced Practice Providers (APPs -  Physician Assistants and Nurse Practitioners) who all work together to provide you with the care you need, when you need it.  We recommend signing up for the patient portal called MyChart.  Sign up information is provided on this After Visit Summary.  MyChart is used to connect with patients for Virtual Visits (Telemedicine).  Patients are able to view lab/test results, encounter notes, upcoming appointments, etc.  Non-urgent messages can be sent to your provider as well.   To learn more about what you can do with MyChart, go to [email or web address].    Your next appointment:   2 month(s)  Provider:   You may see Alm Clay, MD or one of the following Advanced Practice Providers on your designated Care Team:   Lonni Meager, NP Bernardino Bring, PA-C Cadence Franchester, PA-C Tylene Lunch,  NP Barnie Hila, NP    Other Instructions How to Take Your Blood Pressure Blood pressure measures how strongly your blood is pressing against the walls of your arteries. Arteries are blood vessels that carry blood from your heart throughout your body. You can take your blood pressure at home with a machine. You may need to check your blood pressure at home: To check if you have high blood pressure (hypertension). To check your blood pressure over time. To make sure your blood pressure medicine is working. Supplies needed: Blood pressure machine, or monitor. A chair to sit in. This should be a chair where you can sit upright with your back supported. Do not sit on a soft couch or an armchair. Table or desk. Small notebook. Pencil or pen. How to prepare Avoid these things for 30 minutes before checking your blood pressure: Having drinks with caffeine in them, such as coffee or tea. Drinking alcohol. Eating. Smoking. Exercising. Do these things five minutes before checking your blood pressure: Go to the bathroom and pee (urinate). Sit in a chair. Be quiet. Do not talk. How to take your blood pressure Follow the instructions that came with your machine. If you have a digital blood pressure monitor, these may be the instructions: Sit up straight. Place your feet on the floor. Do not cross your ankles or legs. Rest your left arm at the level of your heart. You may rest it on a table, desk, or chair. Pull up your shirt sleeve. Wrap the blood pressure cuff  around the upper part of your left arm. The cuff should be 1 inch (2.5 cm) above your elbow. It is best to wrap the cuff around bare skin. Fit the cuff snugly around your arm, but not too tightly. You should be able to place only one finger between the cuff and your arm. Place the cord so that it rests in the bend of your elbow. Press the power button. Sit quietly while the cuff fills with air and loses air. Write down the  numbers on the screen. Wait 2-3 minutes and then repeat steps 1-10. What do the numbers mean? Two numbers make up your blood pressure. The first number is called systolic pressure. The second is called diastolic pressure. An example of a blood pressure reading is 120 over 80 (or 120/80). If you are an adult and do not have a medical condition, use this guide to find out if your blood pressure is normal: Normal First number: below 120. Second number: below 80. Elevated First number: 120-129. Second number: below 80. Hypertension stage 1 First number: 130-139. Second number: 80-89. Hypertension stage 2 First number: 140 or above. Second number: 90 or above. Your blood pressure is above normal even if only the first or only the second number is above normal. Follow these instructions at home: Medicines Take over-the-counter and prescription medicines only as told by your doctor. Tell your doctor if your medicine is causing side effects. General instructions Check your blood pressure as often as your doctor tells you to. Check your blood pressure at the same time every day. Take your monitor to your next doctor's appointment. Your doctor will: Make sure you are using it correctly. Make sure it is working right. Understand what your blood pressure numbers should be. Keep all follow-up visits. General tips You will need a blood pressure machine or monitor. Your doctor can suggest a monitor. You can buy one at a drugstore or online. When choosing one: Choose one with an arm cuff. Choose one that wraps around your upper arm. Only one finger should fit between your arm and the cuff. Do not choose one that measures your blood pressure from your wrist or finger. Where to find more information American Heart Association: www.heart.org Contact a doctor if: Your blood pressure keeps being high. Your blood pressure is suddenly low. Get help right away if: Your first blood pressure number  is higher than 180. Your second blood pressure number is higher than 120. These symptoms may be an emergency. Do not wait to see if the symptoms will go away. Get help right away. Call 911. Summary Check your blood pressure at the same time every day. Avoid caffeine, alcohol, smoking, and exercise for 30 minutes before checking your blood pressure. Make sure you understand what your blood pressure numbers should be. This information is not intended to replace advice given to you by your health care provider. Make sure you discuss any questions you have with your health care provider. Document Revised: 08/19/2021 Document Reviewed: 08/19/2021 Elsevier Patient Education  2024 [employer].

## 2024-01-25 NOTE — Assessment & Plan Note (Signed)
 Working on cessation

## 2024-01-25 NOTE — Assessment & Plan Note (Signed)
 Stable on Breztri . No need for rescue inhaler. Uses inhaler before exercise. -Continue current regimen.

## 2024-01-25 NOTE — Progress Notes (Signed)
 Cardiology Office Note:  .   Date:  01/25/2024  ID:  Brianna, Gibson 1956-11-07, MRN [id number] PCP: Donnie Handing, PA  [facility] Providers Cardiologist:  None     Chief Complaint  Patient presents with   Follow-up    Follow up. Patient feels well. Medications reviewed verbally.    Patient Profile: .     Brianna Gibson is a  68 y.o. female smoker with a PMH notable for COPD-GOLD 2 who presents here for ~ 2 yr fu at the request of Donnie Handing, [place].  I have only seen her once-March 2023   Brianna Gibson was last seen in March 2023 follow-up from echo and monitor: echo showed moderate MR, she complained of palpitations and was noted to have high blood pressure.  Noted some exertional dyspnea.  Subjective  Discussed the use of AI scribe software for clinical note transcription with the patient, who gave verbal consent to proceed.  History of Present Illness   Brianna Gibson is a 68 year old female with COPD and eczema who presents with recent flare-ups and concerns about blood pressure.  She experiences palpitations with an increased heart rate during physical activity. There is no associated chest pain, pressure, tightness, dizziness, or syncope. Her echocardiogram in February 2023 was normal, and her EKG has remained unchanged.  She has noticed fluctuations in her blood pressure, which she has not previously treated. Hypertension runs in her family. She has reduced her salt intake and is considering monitoring her blood pressure at home.  She reports recent flare-ups of her eczema and COPD. She was switched from Symbicort  to Breztri  for her COPD management. She uses a puff inhaler twice daily, in the morning and before bed, and does not use a rescue inhaler. She occasionally uses a nebulizer when experiencing extreme difficulty breathing.  She recently underwent oral surgery and is currently on antibiotics and mouthwash, but has stopped taking oxycodone .  She reports  a decrease in appetite and has lost approximately six pounds since September, totaling a nine-pound loss since March of the previous year. She typically eats one meal a day and does not consume breakfast.  She has a history of smoking but has not smoked since her recent surgery. She is attempting to quit by using alternatives like peppermint or hard candy to manage cravings, especially when drinking coffee.        Objective   Current Meds  Medication Sig   albuterol  (ACCUNEB ) 1.25 MG/3ML nebulizer solution Inhale into the lungs.   albuterol  (VENTOLIN  HFA) 108 (90 Base) MCG/ACT inhaler Inhale into the lungs.   amoxicillin (AMOXIL) 500 MG tablet Take 500 mg by mouth 3 (three) times daily. =>d 3 of 7   Budeson-Glycopyrrol-Formoterol  (BREZTRI  AEROSPHERE) 160-9-4.8 MCG/ACT AERO Inhale 2 puffs into the lungs in the morning and at bedtime.   chlorhexidine (PERIDEX) 0.12 % solution  - recent oral Sgx - BID   escitalopram (LEXAPRO) 10 MG tablet Take 10 mg by mouth daily.   hydrOXYzine (ATARAX) 25 MG tablet Take 25 mg by mouth every 6 (six) hours.   ibuprofen  (ADVIL ) 600 MG tablet Take 600 mg by mouth every 6 (six) hours as needed.   oxyCODONE  (OXY IR/ROXICODONE ) 5 MG immediate release tablet Take 5 mg by mouth every 6 (six) hours as needed. - post-op   Studies Reviewed: SABRA   EKG Interpretation Date/Time:  Thursday January 25 2024 09:36:43 EST Ventricular Rate:  70 PR Interval:  128 QRS  Duration:  74 QT Interval:  406 QTC Calculation: 438 R Axis:   42  Text Interpretation: Normal sinus rhythm with sinus arrhythmia Borderline criteria for Anterior infarct (cited on or before 06-May-2013) When compared with selected ECG of 02/2022 No significant change since last tracing Confirmed by Anner Lenis ([id number]) on 01/25/2024 10:26:25 AM    TTE 02/09/2022: EF 60 to 65%.  No RWMA.  GR 2 DD.-Mild LA dilation.   Mild MR and mild TR. Normal RV size and function.  Normal estimated RVP/PAP, normal RAP 14-day  Zio patch monitor:Patch Wear Time: 10 days and 4 hours (2023-02-06T09:39:11-499 to 2023-02-16T14:29:21-0500) Predominant rhythm is Sinus Rhythm: Heart rate range 60-134 bpm. Average 86 bpm. Rare isolated PACs and PVCs with rare couplets and triplets. 1 ventricular run-6 beats at a rate of 160 bpm. 20 atrial runs: Fastest 4 beats at a rate of 190 bpm, longest 18 beats with an average rate 122 bpm. No sustained tachycardia or bradycardia. No sustained arrhythmias: Atrial tachycardia, supraventricular tachycardia, atrial fibrillation, atrial flutter, ventricular tachycardia.  Risk Assessment/Calculations:    HYPERTENSION CONTROL Vitals:   01/25/24 0931 01/25/24 1044  BP: (!) 162/88 (!) 154/86    The patient's blood pressure is elevated above target today.  In order to address the patient's elevated BP: Blood pressure will be monitored at home to determine if medication changes need to be made.; Follow up with general cardiology has been recommended. (BP log - ~2-3 month f/u)         Physical Exam:   VS:  BP (!) 154/86   Pulse 74   Ht 5' 7 (1.702 m)   Wt 156 lb 12.8 oz (71.1 kg)   SpO2 98%   BMI 24.56 kg/m    Wt Readings from Last 3 Encounters:  01/25/24 156 lb 12.8 oz (71.1 kg)  09/11/23 161 lb (73 kg)  03/18/23 165 lb (74.8 kg)   - unintentional wgt loss GEN: Well nourished, well groomed in no acute distress; healthy-appearing.  Notable weight loss from previous visits. NECK: No JVD; No carotid bruits CARDIAC: Distant S1, S2; RRR, no murmurs, rubs, gallops RESPIRATORY:  Clear to auscultation without rales, wheezing or rhonchi ; nonlabored, good air movement. ABDOMEN: Soft, non-tender, non-distended EXTREMITIES:  No edema; No deformity; diminished pedal pulses    ASSESSMENT AND PLAN: .    Problem List Items Addressed This Visit       Cardiology Problems   Moderate mitral regurgitation (Chronic)   Echocardiogram actually only suggested mild MR.  Unless pressures  increase or symptoms occur, probably would hold off on rechecking echo at this point. Continue to monitor murmur.      Paroxysmal tachycardia (HCC) - Primary (Chronic)   This seems to be pretty well-controlled and she is not on any medicines for it.  She actually has some rare palpitations but nothing prolonged.  Maintain adequate hydration, avoid excess caffeine      Relevant Orders   EKG 12-Lead (Completed)     Other   Cigarette smoker   Working on cessation.      COPD GOLD 2   Stable on Breztri . No need for rescue inhaler. Uses inhaler before exercise. -Continue current regimen.      DOE (dyspnea on exertion) (Chronic)   Clearly multifactorial between mostly pulmonary issues.  Not necessarily noting any significant dyspnea.      Elevated blood pressure reading   Blood pressure readings have been fluctuating. Patient has family history of hypertension. -Advise patient to monitor  blood pressure at home a few times a week at different times of the day. -Plan to review blood pressure readings in a few months to determine if medication is needed.       Tobacco Use Reduced to less than 4 cigarettes a day. Has not smoked since recent oral surgery. -Encourage continued reduction and eventual cessation of smoking. Oral Health Recent oral surgery. On antibiotics and mouthwash. No longer taking oxycodone . -Continue current post-operative care.  Weight Loss Patient has lost about 9 pounds since March of last year. Appetite has decreased. -Encourage healthy eating habits. Monitor weight.  Follow-up Plan to see patient in 2-3 months to review blood pressure readings and overall health status.  Follow-Up: Return in about 2 months (around 03/24/2024).       Signed, Alm MICAEL Clay, MD, MS Alm Clay, M.D., M.S. Interventional Cardiologist  [facility]  Pager # [phone] Phone # [phone] [address] [place], [place] [address]

## 2024-01-25 NOTE — Assessment & Plan Note (Signed)
 Clearly multifactorial between mostly pulmonary issues.  Not necessarily noting any significant dyspnea.

## 2024-01-25 NOTE — Assessment & Plan Note (Signed)
 Blood pressure readings have been fluctuating. Patient has family history of hypertension. -Advise patient to monitor blood pressure at home a few times a week at different times of the day. -Plan to review blood pressure readings in a few months to determine if medication is needed.

## 2024-01-26 ENCOUNTER — Ambulatory Visit
Admission: RE | Admit: 2024-01-26 | Discharge: 2024-01-26 | Disposition: A | Discharge status: Home / Self Care | Payer: Medicare PPO | Source: Ambulatory Visit | Attending: Family Medicine | Admitting: Family Medicine

## 2024-01-26 DIAGNOSIS — Z1231 Encounter for screening mammogram for malignant neoplasm of breast: Secondary | ICD-10-CM | POA: Insufficient documentation

## 2024-03-28 ENCOUNTER — Encounter: Payer: Self-pay | Admitting: Cardiology

## 2024-03-28 ENCOUNTER — Ambulatory Visit: Payer: Medicare PPO | Attending: Cardiology | Admitting: Cardiology

## 2024-03-28 VITALS — BP 138/82 | HR 68 | Ht 67.0 in | Wt 163.0 lb

## 2024-03-28 DIAGNOSIS — R030 Elevated blood-pressure reading, without diagnosis of hypertension: Secondary | ICD-10-CM | POA: Diagnosis not present

## 2024-03-28 DIAGNOSIS — Z9189 Other specified personal risk factors, not elsewhere classified: Secondary | ICD-10-CM | POA: Insufficient documentation

## 2024-03-28 DIAGNOSIS — I340 Nonrheumatic mitral (valve) insufficiency: Secondary | ICD-10-CM

## 2024-03-28 DIAGNOSIS — J4489 Other specified chronic obstructive pulmonary disease: Secondary | ICD-10-CM

## 2024-03-28 DIAGNOSIS — R0609 Other forms of dyspnea: Secondary | ICD-10-CM

## 2024-03-28 DIAGNOSIS — I479 Paroxysmal tachycardia, unspecified: Secondary | ICD-10-CM | POA: Diagnosis not present

## 2024-03-28 NOTE — Assessment & Plan Note (Signed)
 Previously moderate, now mild mitral regurgitation. Emphasized blood pressure control to prevent worsening. - Monitor for symptoms such as worsening dyspnea or palpitations. - No need for follow-up echocardiogram unless symptoms worsen.

## 2024-03-28 NOTE — Patient Instructions (Addendum)
 Medication Instructions:  No changes at this time.   *If you need a refill on your cardiac medications before your next appointment, please call your pharmacy*  Lab Work: None  If you have labs (blood work) drawn today and your tests are completely normal, you will receive your results only by: MyChart Message (if you have MyChart) OR A paper copy in the mail If you have any lab test that is abnormal or we need to change your treatment, we will call you to review the results.  Testing/Procedures:  Dr. Herbie Baltimore would like you to have this test prior to your follow up appointment in 1 year. Please call our office prior to that appointment and we will review these instructions along with some additional information.     Your cardiac CT will be scheduled at one of the below locations:   [facility] [address] [place], [place] [address] [phone]  OR  [facility] [address] [place], [place] [address] [phone]  OR   [facility] [address] [place], [place] [address] [phone]  OR   [facility] [address] [place], [place] [address] [phone]  OR   [facility] and Vascular Tower [address]  [place], [place] [address] Opening April 15, 2024  If scheduled at [facility], please arrive at the [facility] and Children's Entrance (Entrance C2) of [facility] 30 minutes prior to test start time. You can use the FREE valet parking offered at entrance C (encouraged to control the heart rate for the test)  Proceed to the [facility] Radiology Department (first floor) to check-in and test prep.  All radiology patients and guests should use entrance C2 at [facility], accessed from [facility], even though the hospital's physical address listed is [address].    If scheduled at [facility] or [facility], please arrive 15 mins early for check-in and test prep.  There is spacious parking and easy access to the radiology department from the [facility] entrance. Please enter here and check-in with the desk attendant.   If scheduled at [facility], please arrive 30 minutes early for check-in and test prep.  Please follow these instructions carefully (unless otherwise directed):  An IV will be required for this test and Nitroglycerin will be given.  Hold all erectile dysfunction medications at least 3 days (72 hrs) prior to test. (Ie viagra, cialis, sildenafil, tadalafil, etc)   On the Night Before the Test: Be sure to Drink plenty of water. Do not consume any caffeinated/decaffeinated beverages or chocolate 12 hours prior to your test. Do not take any antihistamines 12 hours prior to your test.   On the Day of the Test: Drink plenty of water until 1 hour prior to the test. Do not eat any food 1 hour prior to test. You may take your regular medications prior to the test.  Take metoprolol (Lopressor) two hours prior to test. If you take Furosemide/Hydrochlorothiazide/Spironolactone/Chlorthalidone, please HOLD on the morning of the test. Patients who wear a continuous glucose monitor MUST remove the device prior to scanning. FEMALES- please wear underwire-free bra if available, avoid dresses & tight clothing      After the Test: Drink plenty of water. After receiving IV contrast, you may experience a mild flushed feeling. This is normal. On occasion, you may  experience a mild rash up to 24 hours after the test. This is not dangerous. If this occurs, you can take Benadryl 25 mg, Zyrtec, Claritin, or Allegra and increase your fluid intake. (Patients taking Tikosyn should avoid Benadryl, and may take Zyrtec, Claritin, or Allegra) If you experience trouble breathing, this can be serious. If it is severe call 911  IMMEDIATELY. If it is mild, please call our office.  We will call to schedule your test 2-4 weeks out understanding that some insurance companies will need an authorization prior to the service being performed.   For more information and frequently asked questions, please visit our website : [email or web address]  For non-scheduling related questions, please contact the cardiac imaging nurse navigator should you have any questions/concerns: Cardiac Imaging Nurse Navigators Direct Office Dial: [phone]   For scheduling needs, including cancellations and rescheduling, please call [place], [phone].   Follow-Up: At [facility], you and your health needs are our priority.  As part of our continuing mission to provide you with exceptional heart care, our providers are all part of one team.  This team includes your primary Cardiologist (physician) and Advanced Practice Providers or APPs (Physician Assistants and Nurse Practitioners) who all work together to provide you with the care you need, when you need it.  Your next appointment:   1 year(s)  Provider:   Bryan Lemma, MD

## 2024-03-28 NOTE — Assessment & Plan Note (Signed)
 Palpitations significant well-controlled.  No more issues.  Maintaining adequate hydration and avoiding excess caffeine.

## 2024-03-28 NOTE — Progress Notes (Signed)
 Cardiology Office Note:  .   Date:  03/28/2024  ID:  Brianna, Gibson 1956/06/08, MRN [id number] PCP: Shane Crutch, PA  [facility] Providers Cardiologist:  None     Chief Complaint  Patient presents with   Follow-up    Reviewed test results.  Doing well.    Patient Profile: .     Brianna Gibson is a 68 y.o. female smoker with a PMH notable for COPD-Gold 2 (emphysema with bronchitis) who presents here for follow-up evaluation of hypertension and palpitations.  Initially referred at the request of Shane Crutch, [place].  I initially saw her in March 2023 and then not again until January 25, 2024.  She was previously evaluated with an echo to monitor.  Echo showed mild MR.   Brianna Gibson was last seen on 01/25/2024-noted increased heart rate during physical activity with no associated chest pain lightheadedness dizziness etc.  Also noticed fluctuations in her blood pressure.  Had lost some weight with decreased appetite..  Trying to quit smoking.  Palpitations controlled.  Review of echo revealed that only evidence of mild MR.  Exertional dyspnea felt to be multifactorial.  Recommended monitoring home blood pressures and follow-up to discuss results in 2 to 3 months.  Subjective  Discussed the use of AI scribe software for clinical note transcription with the patient, who gave verbal consent to proceed.  History of Present Illness History of Present Illness Brianna Gibson is a 68 year old female with hypertension and mitral valve regurgitation who presents for follow-up of her cardiovascular health.  She has hypertension, with her blood pressure improving since her last visit in February, now averaging around 130 mmHg compared to 170 mmHg previously. She attributes this improvement to lifestyle changes, including the use of apple cider vinegar. She monitors her blood pressure at home using a kit purchased from [employer].  She has not had any further episodes of PND, orthopnea or  with trivial edema.  No chest pain or pressure at rest or exertion.  No rapid irregular heartbeats or palpitations.  No syncope or near syncope.  No TIA or amaurosis fugax.  No claudication.  Some exertional dyspnea that is stable is probably attributable to her COPD.  She has a history of mitral valve regurgitation, previously moderate but now reported as mild based on an echocardiogram from 2023. No worsening of symptoms such as shortness of breath or palpitations. She has not experienced chest tightness or pressure during physical activities.  Her asthma is managed with an albuterol inhaler used occasionally, primarily during allergy season. She is not currently using Symbicort or Breztri and has not needed a nebulizer recently.  She manages anxiety and eczema with Lexapro 10 mg and Atarax 10 mg, respectively. Eczema has flared up recently but is controlled with Atarax and a dermatologist-prescribed moisturizer. She has not needed the higher 25 mg dose of Atarax recently.  Socially, she has returned to part-time work after retiring from [employer]. She reports a healthy diet, enjoys cooking, and consumes a variety of fruits and vegetables. She is a smoker, which is a concern given her cardiovascular history.  She understands she needs to quit but is having a hard time doing so.    Objective   No longer on Symbicort.  He is only taking Lexapro 10 mg daily, albuterol inhaler and as needed Atarax.  Studies Reviewed: Marland Kitchen        No new results  TTE 02/09/2022: EF 60 to  65%.  No RWMA.  GR 2 DD.-Mild LA dilation.   Mild MR and mild TR. Normal RV size and function.  Normal estimated RVP/PAP, normal RAP 14-day Zio patch monitor:Patch Wear Time: 10 days and 4 hours (2023-02-06T09:39:11-499 to 2023-02-16T14:29:21-0500) Predominant rhythm is Sinus Rhythm: Heart rate range 60-134 bpm. Average 86 bpm. Rare isolated PACs and PVCs with rare couplets and triplets. 1 ventricular run-6 beats at a  rate of 160 bpm. 20 atrial runs: Fastest 4 beats at a rate of 190 bpm, longest 18 beats with an average rate 122 bpm. No sustained tachycardia or bradycardia. No sustained arrhythmias: Atrial tachycardia, supraventricular tachycardia, atrial fibrillation, atrial flutter, ventricular tachycardia.   Risk Assessment/Calculations:            Physical Exam:   VS:  BP 138/82 (BP Location: Left Arm, Patient Position: Sitting, Cuff Size: Normal)   Pulse 68   Ht 5\' 7"  (1.702 m)   Wt 163 lb (73.9 kg)   SpO2 96%   BMI 25.53 kg/m    Wt Readings from Last 3 Encounters:  03/28/24 163 lb (73.9 kg)  01/25/24 156 lb 12.8 oz (71.1 kg)  09/11/23 161 lb (73 kg)    GEN: healthy appearing; Well nourished, well groomed;  in no acute distress;  NECK: No JVD; No carotid bruits CARDIAC: Normal S1, S2; RRR, no murmurs, rubs, gallops RESPIRATORY:  Clear to auscultation without rales, wheezing or rhonchi ; nonlabored, good air movement. ABDOMEN: Soft, non-tender, non-distended EXTREMITIES:  No edema; No deformity     ASSESSMENT AND PLAN: .    Problem List Items Addressed This Visit       Cardiology Problems   Mild mitral regurgitation (Chronic)   Previously moderate, now mild mitral regurgitation. Emphasized blood pressure control to prevent worsening. - Monitor for symptoms such as worsening dyspnea or palpitations. - No need for follow-up echocardiogram unless symptoms worsen.      Paroxysmal tachycardia (HCC) - Primary (Chronic)   Palpitations significant well-controlled.  No more issues.  Maintaining adequate hydration and avoiding excess caffeine.        Other   Cardiovascular risk factor (Chronic)   Coronary Artery Disease Risk Assessment Smoker with borderline hypertension. Discussed coronary artery disease risk and significance of coronary calcium score. - Order coronary calcium score test as a baseline risk assessment. - Encourage smoking cessation. - Discuss potential lifestyle  modifications including diet and exercise to manage cholesterol levels.      Elevated blood pressure reading (Chronic)   Blood pressure improved to 130 mmHg. No antihypertensive medication. Discussed lifestyle modifications for maintenance. - Monitor blood pressure regularly. - Encourage lifestyle modifications including diet and exercise. - Discuss potential need for medication if blood pressure increases.      Emphysema with chronic bronchitis (HCC) (Chronic)   Using albuterol inhaler as needed. Symptoms may be triggered by pollen and allergies. No current use of Symbicort or Breztri. - Continue albuterol inhaler as needed. - Monitor for asthma symptoms, especially during allergy season. - Consult with pulmonary specialist if needed.      Other Visit Diagnoses       Dyspnea on exertion       Relevant Orders   CT CORONARY MORPH W/CTA COR W/SCORE W/CA W/CM &/OR WO/CM        Follow-Up: Return in about 1 year (around 03/28/2025).     Signed, Marykay Lex, MD, MS Bryan Lemma, M.D., M.S. Interventional Cardiologist  [facility]  Pager # [phone] Phone #  [phone] [address] [place], [place] [address]

## 2024-03-28 NOTE — Assessment & Plan Note (Signed)
 Coronary Artery Disease Risk Assessment Smoker with borderline hypertension. Discussed coronary artery disease risk and significance of coronary calcium score. - Order coronary calcium score test as a baseline risk assessment. - Encourage smoking cessation. - Discuss potential lifestyle modifications including diet and exercise to manage cholesterol levels.

## 2024-03-28 NOTE — Assessment & Plan Note (Signed)
 Blood pressure improved to 130 mmHg. No antihypertensive medication. Discussed lifestyle modifications for maintenance. - Monitor blood pressure regularly. - Encourage lifestyle modifications including diet and exercise. - Discuss potential need for medication if blood pressure increases.

## 2024-03-28 NOTE — Assessment & Plan Note (Signed)
 Using albuterol inhaler as needed. Symptoms may be triggered by pollen and allergies. No current use of Symbicort or Breztri. - Continue albuterol inhaler as needed. - Monitor for asthma symptoms, especially during allergy season. - Consult with pulmonary specialist if needed.

## 2024-04-01 ENCOUNTER — Telehealth: Payer: Self-pay | Admitting: *Deleted

## 2024-04-01 DIAGNOSIS — R0609 Other forms of dyspnea: Secondary | ICD-10-CM

## 2024-04-01 NOTE — Telephone Encounter (Signed)
 Orders updated

## 2024-09-12 LAB — COLOGUARD: COLOGUARD: NEGATIVE

## 2024-11-27 ENCOUNTER — Ambulatory Visit
Admission: RE | Admit: 2024-11-27 | Discharge: 2024-11-27 | Disposition: A | Discharge status: Home / Self Care | Source: Ambulatory Visit | Attending: Family Medicine | Admitting: Family Medicine

## 2024-11-27 DIAGNOSIS — Z87891 Personal history of nicotine dependence: Secondary | ICD-10-CM | POA: Diagnosis present

## 2024-11-27 DIAGNOSIS — F17210 Nicotine dependence, cigarettes, uncomplicated: Secondary | ICD-10-CM | POA: Diagnosis present

## 2024-11-27 DIAGNOSIS — Z122 Encounter for screening for malignant neoplasm of respiratory organs: Secondary | ICD-10-CM | POA: Diagnosis present

## 2024-12-03 ENCOUNTER — Encounter: Payer: Self-pay | Admitting: Pulmonary Disease

## 2024-12-03 ENCOUNTER — Ambulatory Visit: Admitting: Pulmonary Disease

## 2024-12-03 ENCOUNTER — Other Ambulatory Visit: Payer: Self-pay | Admitting: Acute Care

## 2024-12-03 VITALS — BP 138/80 | HR 70 | Temp 98.4°F | Ht 67.0 in | Wt 169.2 lb

## 2024-12-03 DIAGNOSIS — F17210 Nicotine dependence, cigarettes, uncomplicated: Secondary | ICD-10-CM

## 2024-12-03 DIAGNOSIS — Z122 Encounter for screening for malignant neoplasm of respiratory organs: Secondary | ICD-10-CM

## 2024-12-03 DIAGNOSIS — J449 Chronic obstructive pulmonary disease, unspecified: Secondary | ICD-10-CM

## 2024-12-03 DIAGNOSIS — Z87891 Personal history of nicotine dependence: Secondary | ICD-10-CM

## 2024-12-03 MED ORDER — AEROCHAMBER MV MISC: 0 refills | Status: AC

## 2024-12-03 MED ORDER — BREZTRI AEROSPHERE 160-9-4.8 MCG/ACT IN AERO: 2.0000 | INHALATION_SPRAY | Freq: Two times a day (BID) | RESPIRATORY_TRACT | 6 refills | Status: AC

## 2024-12-03 MED ORDER — BREZTRI AEROSPHERE 160-9-4.8 MCG/ACT IN AERO: 2.0000 | INHALATION_SPRAY | Freq: Two times a day (BID) | RESPIRATORY_TRACT | 0 refills | Status: AC

## 2024-12-03 NOTE — Addendum Note (Signed)
 Addended by: VICCI EVALENE DEL on: 12/03/2024 10:51 AM   Modules accepted: Orders

## 2024-12-03 NOTE — Progress Notes (Signed)
 Synopsis: Referred in by Donnie Handing, PA   Subjective:   PATIENT ID: Brianna Gibson GENDER: female DOB: 1956/09/17, MRN: [id number]  Chief Complaint  Patient presents with   COPD    DOE. Cough with white sputum. Wheezing.  Albuterol - BID. Albuterol  NEB- PRN    HPI Brianna Gibson is a 68 year old female patient with a past medical history of tobacco use, COPD previously follows with Dr. Darlean presenting today to the pulmonary clinic to follow-up on her COPD.  She has been doing well overall on Symbicort .  Has some dyspnea on exertion and some cough with sputum production specifically in the morning.  She uses her albuterol  2-3 times a week.  She does have hypersensitivity to dust and chemicals which make her breathing feel worse.  She denies any chest pain any loss of appetite or significant weight loss.  She denies any hemoptysis.  She is up-to-date on her cancer screening.  And enrolled in the low-dose CTs chest last 1 was December 2023 which showed centrilobular and paraseptal emphysema upper lobe predominance bilaterally.  No suspicious nodules or masses observed.  She had a PFT done in January of this year that was consistent with COPD stage II group B.  Family history -strong family history of lung cancer in his mother pancreatic CA and multiple other family members with different cancers.  Social history -active smoker smoked since the age of 26 half a pack per day on average.  Occasional alcohol use.  Denies any illicit drug use.    OV 12/03/2024 - Brianna Gibson is here to follow up on her COPD. I last saw her in September 2024. She has been doing well since then and has no major complaints.  Her PFTs in 2024 showed COPD stage IIb.  She did not require any systemic steroids in the past year.  She is only on albuterol  on an as needed basis.  CAT 17 in office today.  Discussed being on maintenance inhaler and she is agreeable.  Furthermore, she has cut back significantly on her smoking and now  smokes 3 packs/week.  I congratulated her on that and advised her to stop smoking completely which she is motivated to do.  I reviewed her low-dose CT scan which did not show any suspicious lesions.   ROS All systems were reviewed and are negative except for the above.  Objective:   There were no vitals filed for this visit.    on RA BMI Readings from Last 3 Encounters:  03/28/24 25.53 kg/m  01/25/24 24.56 kg/m  09/11/23 25.22 kg/m   Wt Readings from Last 3 Encounters:  03/28/24 163 lb (73.9 kg)  01/25/24 156 lb 12.8 oz (71.1 kg)  09/11/23 161 lb (73 kg)    Physical Exam GEN: NAD, Healthy Appearing HEENT: Supple Neck, Reactive Pupils, EOMI  CVS: Normal S1, Normal S2, RRR, No murmurs or ES appreciated  Lungs: Poor air movement.  Abdomen: Soft, non tender, non distended, + BS  Extremities: Warm and well perfused, No edema   Labs and imaging were reviewed.  Ancillary Information   CBC    Component Value Date/Time   WBC 8.3 04/16/2016 0428   RBC 3.64 (L) 04/16/2016 0428   HGB 11.3 (L) 04/16/2016 0428   HGB 13.5 05/06/2013 1952   HCT 32.9 (L) 04/16/2016 0428   HCT 39.9 05/06/2013 1952   PLT 228 04/16/2016 0428   PLT 200 05/06/2013 1952   MCV 90.5 04/16/2016 0428   MCV 88 05/06/2013  1952   MCH 31.0 04/16/2016 0428   MCHC 34.3 04/16/2016 0428   RDW 13.0 04/16/2016 0428   RDW 13.1 05/06/2013 1952   LYMPHSABS 0.7 (L) 04/14/2016 1018   MONOABS 0.7 04/14/2016 1018   EOSABS 0.0 04/14/2016 1018   BASOSABS 0.0 04/14/2016 1018    Imaging  Low Dose CT Chest 01/24 -lung RADS 1 negative for nodules.  Centrilobular and paraseptal emphysema predominantly in the upper lobes.   PFTs 01/25 FEV1 1.48 L 55% of predicted.  FVC 2.31 L 65% of predicted.  FEV1 to FVC 64.  TLC 92% of predicted.  RV 119% of predicted.  DLCO 61% of predicted.    Latest Ref Rng & Units 01/12/2023    8:45 AM  PFT Results  FVC-Pre L 2.31   FVC-Predicted Pre % 65   FVC-Post L 2.49   FVC-Predicted  Post % 71   Pre FEV1/FVC % % 64   Post FEV1/FCV % % 66   FEV1-Pre L 1.48   FEV1-Predicted Pre % 55   FEV1-Post L 1.63   DLCO uncorrected ml/min/mmHg 13.36   DLCO UNC% % 61   DLVA Predicted % 89   TLC L 5.11   TLC % Predicted % 92   RV % Predicted % 119      Assessment & Plan:  Brianna Gibson is a 68 year old female patient with a past medical history of tobacco use, COPD previously follows with Dr. Darlean presenting today to the pulmonary clinic to follow-up on her COPD.  # COPD stage II group B CAT 13 CAT 17 11/2024 Given ongoing dyspnea and cough will escalate to triple therapy with Breztri .  []  Start budesonide -formoterol -g 4.8 lycopyrrolate [Breztri ] 160-9-4.8 2 puffs twice a day. []  Continue albuterol  2 puffs every 6 hours as needed.  # Lung cancer screening Already enrolled in low-dose CT scan program.  Has repeat CT chest December of 2026.  Previous 1 lung RADS 1.  Smoking/Tobacco Cessation Counseling Brianna Gibson is a current user of tobacco or nicotine  products. She is ready to quit at this time. Counseling provided today addressed the risks of continued use and the benefits of cessation. Discussed tobacco/nicotine  use history, readiness to quit, and evidence-based treatment options including behavioral strategies, support resources, and pharmacologic therapies. Provided encouragement and educational materials on steps and resources to quit smoking. Patient questions were addressed, and follow-up recommended for continued support. Total time spent on counseling: 4 minutes.     RTC 6 months   I personally spent a total of 40 minutes in the care of the patient today including preparing to see the patient, getting/reviewing separately obtained history, performing a medically appropriate exam/evaluation, counseling and educating, placing orders, documenting clinical information in the EHR, independently interpreting results, and communicating results.   Darrin Barn,  MD [facility] 12/03/2024 9:16 AM

## 2024-12-27 ENCOUNTER — Other Ambulatory Visit: Payer: Self-pay | Admitting: Family Medicine

## 2024-12-27 DIAGNOSIS — Z1231 Encounter for screening mammogram for malignant neoplasm of breast: Secondary | ICD-10-CM

## 2025-01-27 ENCOUNTER — Encounter

## 2025-04-01 ENCOUNTER — Other Ambulatory Visit
